# Patient Record
Sex: Male | Born: 1970
Health system: Southern US, Community
[De-identification: ages and names within clinical notes are randomized; demographics above are authoritative.]

## PROBLEM LIST (undated history)

## (undated) DIAGNOSIS — E785 Hyperlipidemia, unspecified: Secondary | ICD-10-CM

## (undated) DIAGNOSIS — B019 Varicella without complication: Secondary | ICD-10-CM

## (undated) HISTORY — DX: Varicella without complication: B01.9

## (undated) HISTORY — DX: Hyperlipidemia, unspecified: E78.5

---

## 2015-08-10 ENCOUNTER — Encounter: Payer: Self-pay | Admitting: Primary Care

## 2015-08-10 ENCOUNTER — Ambulatory Visit (INDEPENDENT_AMBULATORY_CARE_PROVIDER_SITE_OTHER): Payer: BLUE CROSS/BLUE SHIELD | Admitting: Primary Care

## 2015-08-10 VITALS — BP 138/86 | HR 91 | Temp 97.9°F | Ht 69.25 in | Wt 225.0 lb

## 2015-08-10 DIAGNOSIS — E785 Hyperlipidemia, unspecified: Secondary | ICD-10-CM | POA: Diagnosis not present

## 2015-08-10 NOTE — Progress Notes (Signed)
Pre visit review using our clinic review tool, if applicable. No additional management support is needed unless otherwise documented below in the visit note. 

## 2015-08-10 NOTE — Assessment & Plan Note (Signed)
Endorses slightly elevated lipids at work health screening. Will repeat at upcoming physical. No prior medication use.

## 2015-08-10 NOTE — Patient Instructions (Signed)
Please schedule a physical with me within the next 3-6 months. You may also schedule a lab only appointment 3-4 days prior. We will discuss your lab results in detail during your physical.  It was a pleasure to meet you today! Please don't hesitate to call me with any questions. Welcome to [facility]!    

## 2015-08-10 NOTE — Progress Notes (Signed)
   Subjective:    Patient ID: Austin Benson, male    DOB: March 27, 1971, 45 y.o.   MRN: [id number]  HPI  Austin Benson is a 45 year old male who presents today to establish care. He has no complaints today. His last physical was several years ago, but does a wellness check at work annually which includes brief lipid panel. Will obtain old records.  1) Hyperlipidemia: History of TC of 207, elevation in LDL and low HDL per patient. Completes this annually through work, no complete panel. He's never been managed on medication.     Review of Systems  Constitutional: Negative for unexpected weight change.  HENT: Negative for rhinorrhea.   Respiratory: Negative for cough and shortness of breath.   Cardiovascular: Negative for chest pain.  Gastrointestinal: Negative for diarrhea and constipation.  Genitourinary: Negative for difficulty urinating.  Musculoskeletal: Negative for myalgias.       Occasional hand and feet cramping.   Skin: Negative for rash.  Allergic/Immunologic: Positive for environmental allergies.  Neurological: Negative for dizziness, numbness and headaches.  Psychiatric/Behavioral:       Denies concerns for anxiety or depression       Past Medical History  Diagnosis Date  . Chickenpox   . Hyperlipidemia     Social History   Social History  . Marital Status: Married    Spouse Name: N/A  . Number of Children: N/A  . Years of Education: N/A   Occupational History  . Not on file.   Social History Main Topics  . Smoking status: Former Games developer  . Smokeless tobacco: Not on file  . Alcohol Use: No  . Drug Use: Not on file  . Sexual Activity: Not on file   Other Topics Concern  . Not on file   Social History Narrative   Married.   2 children, 3 grandchildren.   Work's as a Chartered certified accountant.    Enjoys hunting, fishing.     History reviewed. No pertinent past surgical history.  Family History  Problem Relation Age of Onset  . Arthritis Mother   . Diabetes Father     . Diabetes Maternal Grandmother   . Arthritis Maternal Grandmother   . Diabetes Maternal Grandfather   . Heart attack Father 70    Allergies not on file  No current outpatient prescriptions on file prior to visit.   No current facility-administered medications on file prior to visit.    BP 138/86 mmHg  Pulse 91  Temp(Src) 97.9 F (36.6 C) (Oral)  Ht 5' 9.25" (1.759 m)  Wt 225 lb (102.059 kg)  BMI 32.99 kg/m2  SpO2 97%    Objective:   Physical Exam  Constitutional: He is oriented to person, place, and time. He appears well-nourished.  Cardiovascular: Normal rate and regular rhythm.   Pulmonary/Chest: Effort normal and breath sounds normal.  Neurological: He is alert and oriented to person, place, and time.  Skin: Skin is warm and dry.  Psychiatric: He has a normal mood and affect.          Assessment & Plan:  Establish Care:  No complaints today. Last physical was years ago, will get basic lipid panel through occupation. Exam unremarkable. He is to schedule complete physical in 3-6 months. PMH, SH, and FH reviewed.

## 2015-11-16 ENCOUNTER — Telehealth: Payer: Self-pay | Admitting: Primary Care

## 2015-11-16 ENCOUNTER — Encounter: Payer: Self-pay | Admitting: Primary Care

## 2015-11-16 ENCOUNTER — Ambulatory Visit (INDEPENDENT_AMBULATORY_CARE_PROVIDER_SITE_OTHER)
Admission: RE | Admit: 2015-11-16 | Discharge: 2015-11-16 | Disposition: A | Discharge status: Home / Self Care | Payer: BLUE CROSS/BLUE SHIELD | Source: Ambulatory Visit | Attending: Primary Care | Admitting: Primary Care

## 2015-11-16 ENCOUNTER — Ambulatory Visit (INDEPENDENT_AMBULATORY_CARE_PROVIDER_SITE_OTHER): Payer: BLUE CROSS/BLUE SHIELD | Admitting: Primary Care

## 2015-11-16 VITALS — BP 130/86 | HR 104 | Temp 98.4°F | Ht 69.25 in | Wt 229.0 lb

## 2015-11-16 DIAGNOSIS — R059 Cough, unspecified: Secondary | ICD-10-CM

## 2015-11-16 DIAGNOSIS — R05 Cough: Secondary | ICD-10-CM

## 2015-11-16 NOTE — Telephone Encounter (Signed)
Patient returned Chan's call. °

## 2015-11-16 NOTE — Progress Notes (Signed)
Pre visit review using our clinic review tool, if applicable. No additional management support is needed unless otherwise documented below in the visit note. 

## 2015-11-16 NOTE — Patient Instructions (Signed)
Stop by the pharmacy and pick up omeprazole 20 mg tablets. Take 1 tablet by mouth every day for 4 weeks.  Complete xray(s) prior to leaving today. I will notify you of your results once received.  Please notify me if no improvement in cough in 2 weeks.  It was a pleasure to see you today!

## 2015-11-16 NOTE — Telephone Encounter (Signed)
Called and notified patient of Austin Benson's comments. Patient verbalized understanding.  

## 2015-11-16 NOTE — Progress Notes (Signed)
Subjective:    Patient ID: Austin Benson, male    DOB: 1970/08/02, 45 y.o.   MRN: [id number]  HPI  Mr. Austin Benson is a 45 year old male who presents today with a chief complaint of cough. He's been coughing intermittently since February 2017. His cough is non productive and is present moreso with activity. Formerly dipped tobacco in the past, smoked for 6 months total, several years ago. His cough has become more bothersome over the past several weeks. He denies fevers, congestion, feeling ill, fatigued, body aches. He's taken Mucinex, Benadryl, Allegra D, Delsym, Robitussin without improvement. He does occasionally get heart burn once weekly. Denies allergy symptoms.  Review of Systems  Constitutional: Negative for fever.  HENT: Negative for congestion, sinus pressure and sore throat.   Respiratory: Positive for cough. Negative for shortness of breath and wheezing.   Gastrointestinal:       Occasional reflux.  Musculoskeletal: Negative for myalgias.       Past Medical History  Diagnosis Date  . Chickenpox   . Hyperlipidemia      Social History   Social History  . Marital Status: Married    Spouse Name: N/A  . Number of Children: N/A  . Years of Education: N/A   Occupational History  . Not on file.   Social History Main Topics  . Smoking status: Former Games developermoker  . Smokeless tobacco: Not on file  . Alcohol Use: No  . Drug Use: Not on file  . Sexual Activity: Not on file   Other Topics Concern  . Not on file   Social History Narrative   Married.   2 children, 3 grandchildren.   Work's as a Chartered certified accountantmachinist.    Enjoys hunting, fishing.     No past surgical history on file.  Family History  Problem Relation Age of Onset  . Arthritis Mother   . Diabetes Father   . Diabetes Maternal Grandmother   . Arthritis Maternal Grandmother   . Diabetes Maternal Grandfather   . Heart attack Father [id number]    No Known Allergies  No current outpatient prescriptions on file prior to  visit.   No current facility-administered medications on file prior to visit.    BP 130/86 mmHg  Pulse 104  Temp(Src) 98.4 F (36.9 C) (Oral)  Ht 5' 9.25" (1.759 m)  Wt 229 lb (103.874 kg)  BMI 33.57 kg/m2  SpO2 97%    Objective:   Physical Exam  Constitutional: He appears well-nourished.  HENT:  Right Ear: Tympanic membrane and ear canal normal.  Left Ear: Tympanic membrane and ear canal normal.  Nose: No mucosal edema. Right sinus exhibits no maxillary sinus tenderness and no frontal sinus tenderness. Left sinus exhibits no maxillary sinus tenderness and no frontal sinus tenderness.  Mouth/Throat: Oropharynx is clear and moist.  Eyes: Conjunctivae are normal.  Neck: Neck supple.  Cardiovascular: Normal rate and regular rhythm.   Pulmonary/Chest: Effort normal and breath sounds normal. He has no wheezes. He has no rales.  Skin: Skin is warm and dry.          Assessment & Plan:  Cough:  Present since February 2017, worse over the last several weeks. No symptoms of bacterial, viral, or allergy symptoms. Does have occasional reflux. Exam today unremarkable. Lungs clear, no wheezing, PND, congestion. Chest Xray today: Negative. Could be GERD and wills start with temporary low dose PPI x 4 weeks. If no improvement, will consider antihistamine, however, he's tried Allegra D without improvement.  May also need to consider pulmonology referral for PFT's if no improvement.

## 2015-12-01 ENCOUNTER — Telehealth: Payer: Self-pay | Admitting: Primary Care

## 2015-12-01 NOTE — Telephone Encounter (Signed)
Pt states he's doing so much better the cough is almost gone, he has two more weeks of the omeprazole to take. He would like to know if he needs to continue taking the omeprazole after the 4 week period?

## 2015-12-01 NOTE — Telephone Encounter (Signed)
Left message to return call 

## 2015-12-01 NOTE — Telephone Encounter (Signed)
Please have him stop taking after 4 weeks and to notify me if cough returns.

## 2015-12-01 NOTE — Telephone Encounter (Signed)
-----   Message from Doreene NestKatherine K Alyha Marines, NP sent at 11/16/2015 11:19 AM EDT ----- Regarding: Cough Will you please check on Austin Benson's cough. We recently started him on omeprazole for his cough. Any improvement?

## 2015-12-01 NOTE — Telephone Encounter (Signed)
Left detailed VM.  

## 2016-04-21 DIAGNOSIS — M79671 Pain in right foot: Secondary | ICD-10-CM | POA: Diagnosis not present

## 2016-04-21 DIAGNOSIS — M722 Plantar fascial fibromatosis: Secondary | ICD-10-CM | POA: Diagnosis not present

## 2016-05-12 DIAGNOSIS — M79671 Pain in right foot: Secondary | ICD-10-CM | POA: Diagnosis not present

## 2016-05-12 DIAGNOSIS — M722 Plantar fascial fibromatosis: Secondary | ICD-10-CM | POA: Diagnosis not present

## 2016-05-23 ENCOUNTER — Telehealth: Payer: Self-pay | Admitting: Primary Care

## 2016-05-23 NOTE — Telephone Encounter (Signed)
Patient Name: Austin Benson  DOB: 1970/09/11    Initial Comment Caller states husband is having chest pain.   Nurse Assessment  Nurse: Scarlette ArStandifer, RN, Heather Date/Time (Eastern Time): 05/23/2016 11:50:16 AM  Confirm and document reason for call. If symptomatic, describe symptoms. You must click the next button to save text entered. ---Caller states husband is having chest pain for the last month, he has a history of anxiety and reflux. The reflux has been getting worse lately. He is not with her right now, he is at work.  Has the patient traveled out of the country within the last 30 days? ---Not Applicable  Does the patient have any new or worsening symptoms? ---Yes  Will a triage be completed? ---No  Select reason for no triage. ---Other  Please document clinical information provided and list any resource used. ---Caller informed that triage cannot be done since her husband is not with her, but it is something that is urgent that needs to be taken care of ASAP. Caller verbalizes understanding and states that she will talk to him ASAP

## 2016-05-23 NOTE — Telephone Encounter (Signed)
Pt called back; pt had CP last week and thinks may have been heartburn; no pain today.pt scheduled appt to see Mayra ReelKate Clark NP on 05/24/16 at 4 pm. FYI to Mayra ReelKate Clark NP.

## 2016-05-23 NOTE — Telephone Encounter (Signed)
Unable to reach Mr or Mrs. Gehres; left v/m [facility]

## 2016-05-23 NOTE — Telephone Encounter (Signed)
Noted  

## 2016-05-24 ENCOUNTER — Encounter: Payer: Self-pay | Admitting: Primary Care

## 2016-05-24 ENCOUNTER — Ambulatory Visit (INDEPENDENT_AMBULATORY_CARE_PROVIDER_SITE_OTHER): Payer: BLUE CROSS/BLUE SHIELD | Admitting: Primary Care

## 2016-05-24 VITALS — BP 160/110 | HR 101 | Temp 98.2°F | Ht 69.25 in | Wt 230.8 lb

## 2016-05-24 DIAGNOSIS — R030 Elevated blood-pressure reading, without diagnosis of hypertension: Secondary | ICD-10-CM | POA: Diagnosis not present

## 2016-05-24 DIAGNOSIS — K219 Gastro-esophageal reflux disease without esophagitis: Secondary | ICD-10-CM | POA: Diagnosis not present

## 2016-05-24 DIAGNOSIS — I10 Essential (primary) hypertension: Secondary | ICD-10-CM | POA: Insufficient documentation

## 2016-05-24 MED ORDER — RANITIDINE HCL 150 MG PO TABS: ORAL_TABLET | ORAL | 1 refills | Status: DC

## 2016-05-24 NOTE — Progress Notes (Signed)
Pre visit review using our clinic review tool, if applicable. No additional management support is needed unless otherwise documented below in the visit note. 

## 2016-05-24 NOTE — Assessment & Plan Note (Addendum)
Chest pain with esophageal burning and belching, improved with PPI use. Suspect chest pain to be secondary to esophageal reflux. No alarm signs concerning for acute MI. Education provided regarding triggers. Will have him trial Zantac 150 mg daily-twice daily. If no improvement will consider PPI as this has historically worked. Will call patient in 2 weeks for an update in symptoms on Zantac.

## 2016-05-24 NOTE — Assessment & Plan Note (Signed)
Above goal in the office today, also endorses elevated reading at podiatrist office and home readings. Will have him monitor BP at home x 2 weeks. If above goal then will initiate Lisinopril.

## 2016-05-24 NOTE — Progress Notes (Signed)
   Subjective:    Patient ID: Austin Benson, male    DOB: September 17, 1970, 45 y.o.   MRN: [id number]  HPI  Austin Benson is a 45 year old male with a history of GERD who presents today with a chief complaint of chest pain. His pain is located to the upper mid chest with associated symptoms of esophageal burning and belching. He has been taking Omeprazole intermittently for GERD symptoms with improvement/resolve of heart burn symptoms and chest pain. As soon as he stopped taking the omeprazole he started experiencing his chest pain with esophageal burning. He denies nausea, radiation of his pain, diaphoresis.   2) Elevated Blood Pressure Reading: No prior history of hypertension. His BP is 160/110 today. His BP was "high" at his podiatrist office 6 weeks ago. He's checking his BP at home with readings of 130-150's/80's (cannot remember exactly) over the last several months. He has a family history of MI in his father. He experiences occasional headaches (mostly from sinus pressure). Denies visual changes, shortness of breath, weakness, dizziness.    Review of Systems  Constitutional: Negative for diaphoresis.  Eyes: Negative for visual disturbance.  Respiratory: Negative for cough, shortness of breath and wheezing.   Cardiovascular: Positive for chest pain. Negative for palpitations.  Gastrointestinal: Negative for abdominal pain and nausea.       Esophageal burning, belching  Neurological: Negative for dizziness and weakness.       Past Medical History:  Diagnosis Date  . Chickenpox   . Hyperlipidemia      Social History   Social History  . Marital status: Married    Spouse name: N/A  . Number of children: N/A  . Years of education: N/A   Occupational History  . Not on file.   Social History Main Topics  . Smoking status: Former Games developermoker  . Smokeless tobacco: Not on file  . Alcohol use No  . Drug use: Unknown  . Sexual activity: Not on file   Other Topics Concern  . Not on file    Social History Narrative   Married.   2 children, 3 grandchildren.   Work's as a Chartered certified accountantmachinist.    Enjoys hunting, fishing.     No past surgical history on file.  Family History  Problem Relation Age of Onset  . Arthritis Mother   . Diabetes Father   . Diabetes Maternal Grandmother   . Arthritis Maternal Grandmother   . Diabetes Maternal Grandfather   . Heart attack Father [phone]    No Known Allergies  No current outpatient prescriptions on file prior to visit.   No current facility-administered medications on file prior to visit.     BP (!) 160/110   Pulse (!) 101   Temp 98.2 F (36.8 C) (Oral)   Ht 5' 9.25" (1.759 m)   Wt 230 lb 12.8 oz (104.7 kg)   SpO2 96%   BMI 33.84 kg/m    Objective:   Physical Exam  Constitutional: He appears well-nourished.  Neck: Neck supple.  Cardiovascular: Normal rate and regular rhythm.   Pulmonary/Chest: Effort normal and breath sounds normal.  Abdominal: Soft. Bowel sounds are normal. There is no tenderness.  Skin: Skin is warm and dry.          Assessment & Plan:

## 2016-05-24 NOTE — Patient Instructions (Signed)
Start ranitidine (Zantac) 150 mg tablets daily for acid reflux. Take 1 tablet by mouth once daily. If no improvement then may take 1 tablet twice daily.  Avoid laying down after eating. You should wait 2 hours before laying down after eating any meal.  Take a look at the trigger foods associated with acid reflux.  Check your blood pressure daily, around the same time of day, for the next 2 weeks.  Ensure that you have rested for 30 minutes prior to checking your blood pressure. Record your readings as I will call you for those readings.  I will check on you in 2 weeks!  Food Choices for Gastroesophageal Reflux Disease, Adult When you have gastroesophageal reflux disease (GERD), the foods you eat and your eating habits are very important. Choosing the right foods can help ease the discomfort of GERD. WHAT GENERAL GUIDELINES DO I NEED TO FOLLOW?  Choose fruits, vegetables, whole grains, low-fat dairy products, and low-fat meat, fish, and poultry.  Limit fats such as oils, salad dressings, butter, nuts, and avocado.  Keep a food diary to identify foods that cause symptoms.  Avoid foods that cause reflux. These may be different for different people.  Eat frequent small meals instead of three large meals each day.  Eat your meals slowly, in a relaxed setting.  Limit fried foods.  Cook foods using methods other than frying.  Avoid drinking alcohol.  Avoid drinking large amounts of liquids with your meals.  Avoid bending over or lying down until 2-3 hours after eating. WHAT FOODS ARE NOT RECOMMENDED? The following are some foods and drinks that may worsen your symptoms: Vegetables Tomatoes. Tomato juice. Tomato and spaghetti sauce. Chili peppers. Onion and garlic. Horseradish. Fruits Oranges, grapefruit, and lemon (fruit and juice). Meats High-fat meats, fish, and poultry. This includes hot dogs, ribs, ham, sausage, salami, and bacon. Dairy Whole milk and chocolate milk. Sour  cream. Cream. Butter. Ice cream. Cream cheese.  Beverages Coffee and tea, with or without caffeine. Carbonated beverages or energy drinks. Condiments Hot sauce. Barbecue sauce.  Sweets/Desserts Chocolate and cocoa. Donuts. Peppermint and spearmint. Fats and Oils High-fat foods, including JamaicaFrench fries and potato chips. Other Vinegar. Strong spices, such as black pepper, white pepper, red pepper, cayenne, curry powder, cloves, ginger, and chili powder. The items listed above may not be a complete list of foods and beverages to avoid. Contact your dietitian for more information.   This information is not intended to replace advice given to you by your health care provider. Make sure you discuss any questions you have with your health care provider.   Document Released: 07/04/2005 Document Revised: 07/25/2014 Document Reviewed: 05/08/2013 Elsevier Interactive Patient Education [employer]2016 Elsevier Inc.

## 2016-06-01 ENCOUNTER — Telehealth: Payer: Self-pay

## 2016-06-01 DIAGNOSIS — I10 Essential (primary) hypertension: Secondary | ICD-10-CM

## 2016-06-01 MED ORDER — LISINOPRIL 10 MG PO TABS: 10.0000 mg | ORAL_TABLET | Freq: Every day | ORAL | 1 refills | Status: DC

## 2016-06-01 NOTE — Telephone Encounter (Signed)
Mrs Ladona RidgelRatliff called pt last seen 05/24/2016.BP readings;  05/25/16 BP 137/79 05/26/16 BP 140/89 05/27/16 BP 161/92 05/28/16 BP 124/96 05/29/16 BP 141/82 05/30/16 BP 141/93 05/31/16 BP 143/84 06/01/16 BP 141/84 Pt is not having any H/A, CP or dizziness. Mrs Ladona RidgelRatliff said can call pt back after 3:30 on 06/02/16. [facility].

## 2016-06-01 NOTE — Telephone Encounter (Signed)
Please notify patient that his blood pressure is too high which will require treatment with medication. I've sent a prescription of Lisinopril 10 mg to his pharmacy. Take 1 tablet by mouth once daily. Please have them continue to monitor his BP daily and schedule him for follow up in the office in 2-3 weeks for recheck.

## 2016-06-02 NOTE — Telephone Encounter (Signed)
Patient advised.   Patient says he has to have a 4 pm appt and that was not available.  I advised the patient to phone in within the next few days to see if an exception could be made.

## 2016-06-03 NOTE — Telephone Encounter (Signed)
Please call patient today and get him scheduled for follow up in 2-3 weeks. We will work with his schedule.

## 2016-06-06 NOTE — Telephone Encounter (Signed)
Spoken to patient and patient is coming on 06/20/2016 since he took a day off

## 2016-06-07 ENCOUNTER — Telehealth: Payer: Self-pay | Admitting: Primary Care

## 2016-06-07 NOTE — Telephone Encounter (Signed)
-----   Message from Doreene NestKatherine K Clark, NP sent at 05/24/2016  4:24 PM EST ----- Regarding: BP and GERD Please check on patient's blood pressure. What are his readings? Any improvement in acid reflux since we started Zantac?

## 2016-06-13 NOTE — Telephone Encounter (Signed)
Message left for patient to return my call.  

## 2016-06-20 ENCOUNTER — Ambulatory Visit (INDEPENDENT_AMBULATORY_CARE_PROVIDER_SITE_OTHER): Payer: BLUE CROSS/BLUE SHIELD | Admitting: Primary Care

## 2016-06-20 ENCOUNTER — Encounter: Payer: Self-pay | Admitting: Primary Care

## 2016-06-20 DIAGNOSIS — R030 Elevated blood-pressure reading, without diagnosis of hypertension: Secondary | ICD-10-CM

## 2016-06-20 DIAGNOSIS — I10 Essential (primary) hypertension: Secondary | ICD-10-CM | POA: Diagnosis not present

## 2016-06-20 LAB — BASIC METABOLIC PANEL
BUN: 21 mg/dL (ref 6–23)
CALCIUM: 10 mg/dL (ref 8.4–10.5)
CO2: 29 meq/L (ref 19–32)
CREATININE: 1.06 mg/dL (ref 0.40–1.50)
Chloride: 102 mEq/L (ref 96–112)
GFR: 79.43 mL/min (ref >60.00)
Glucose, Bld: 119 mg/dL — ABNORMAL HIGH (ref 70–99)
Potassium: 4.8 mEq/L (ref 3.5–5.1)
SODIUM: 140 meq/L (ref 135–145)

## 2016-06-20 MED ORDER — LISINOPRIL 10 MG PO TABS: 10.0000 mg | ORAL_TABLET | Freq: Every day | ORAL | 3 refills | Status: DC

## 2016-06-20 NOTE — Assessment & Plan Note (Signed)
Improved on Lisinopril 10 mg.  Home readings stable.  Discussed to monitor 1-2 x monthly, report readings at or above 140/90. BMP pending today. Refills sent through mail order pharmacy.

## 2016-06-20 NOTE — Progress Notes (Signed)
   Subjective:    Patient ID: Austin Benson, male    DOB: October 18, 1970, 45 y.o.   MRN: [id number]  HPI  Mr. Austin Benson is a 45 year old male who presents today for follow up of hypertension. He was evaluated in early November 2017 with complaints of elevated blood pressure. He had numerous prior elevated readings. He was initiated on Lisinopril 10 mg tablets several weeks later due to elevated home readings. He is here today for follow up.  Since his last visit he's been checking his BP at home. He is getting readings of 120-130's/80's. He denies chest pain, dizziness, headaches, cough, lower extremity edema. He's compliant to his medication.  Review of Systems  Respiratory: Negative for cough and shortness of breath.   Cardiovascular: Negative for chest pain and leg swelling.  Neurological: Negative for dizziness and headaches.       Past Medical History:  Diagnosis Date  . Chickenpox   . Hyperlipidemia      Social History   Social History  . Marital status: Married    Spouse name: N/A  . Number of children: N/A  . Years of education: N/A   Occupational History  . Not on file.   Social History Main Topics  . Smoking status: Former Games developermoker  . Smokeless tobacco: Not on file  . Alcohol use No  . Drug use: Unknown  . Sexual activity: Not on file   Other Topics Concern  . Not on file   Social History Narrative   Married.   2 children, 3 grandchildren.   Work's as a Chartered certified accountantmachinist.    Enjoys hunting, fishing.     No past surgical history on file.  Family History  Problem Relation Age of Onset  . Arthritis Mother   . Diabetes Father   . Diabetes Maternal Grandmother   . Arthritis Maternal Grandmother   . Diabetes Maternal Grandfather   . Heart attack Father [phone]    No Known Allergies  Current Outpatient Prescriptions on File Prior to Visit  Medication Sig Dispense Refill  . etodolac (LODINE) 500 MG tablet Take 500 mg by mouth 2 (two) times daily.    . ranitidine  (ZANTAC) 150 MG tablet Take 1 tablet by mouth 1-2 times daily for acid reflux. 60 tablet 1   No current facility-administered medications on file prior to visit.     BP 140/88   Pulse 81   Temp 98.5 F (36.9 C) (Oral)   Ht 5\' 9"  (1.753 m)   Wt 228 lb 12.8 oz (103.8 kg)   SpO2 97%   BMI 33.79 kg/m    Objective:   Physical Exam  Constitutional: He appears well-nourished.  Neck: Neck supple.  Cardiovascular: Normal rate and regular rhythm.   Pulmonary/Chest: Effort normal and breath sounds normal.  Skin: Skin is warm and dry.          Assessment & Plan:

## 2016-06-20 NOTE — Progress Notes (Signed)
Pre visit review using our clinic review tool, if applicable. No additional management support is needed unless otherwise documented below in the visit note. 

## 2016-06-20 NOTE — Patient Instructions (Addendum)
Complete lab work prior to leaving today. I will notify you of your results once received.   I sent refills of Lisinopril 10 mg through [facility]. You can take this medication either in the morning or in the evening.  It was a pleasure to see you today!   Hypertension Hypertension, commonly called high blood pressure, is when the force of blood pumping through your arteries is too strong. Your arteries are the blood vessels that carry blood from your heart throughout your body. A blood pressure reading consists of a higher number over a lower number, such as 110/72. The higher number (systolic) is the pressure inside your arteries when your heart pumps. The lower number (diastolic) is the pressure inside your arteries when your heart relaxes. Ideally you want your blood pressure below 120/80. Hypertension forces your heart to work harder to pump blood. Your arteries may become narrow or stiff. Having untreated or uncontrolled hypertension can cause heart attack, stroke, kidney disease, and other problems. What increases the risk? Some risk factors for high blood pressure are controllable. Others are not. Risk factors you cannot control include:  Race. You may be at higher risk if you are African American.  Age. Risk increases with age.  Gender. Men are at higher risk than women before age 10145 years. After age 47, women are at higher risk than men. Risk factors you can control include:  Not getting enough exercise or physical activity.  Being overweight.  Getting too much fat, sugar, calories, or salt in your diet.  Drinking too much alcohol. What are the signs or symptoms? Hypertension does not usually cause signs or symptoms. Extremely high blood pressure (hypertensive crisis) may cause headache, anxiety, shortness of breath, and nosebleed. How is this diagnosed? To check if you have hypertension, your health care provider will measure your blood pressure while you are seated,  with your arm held at the level of your heart. It should be measured at least twice using the same arm. Certain conditions can cause a difference in blood pressure between your right and left arms. A blood pressure reading that is higher than normal on one occasion does not mean that you need treatment. If it is not clear whether you have high blood pressure, you may be asked to return on a different day to have your blood pressure checked again. Or, you may be asked to monitor your blood pressure at home for 1 or more weeks. How is this treated? Treating high blood pressure includes making lifestyle changes and possibly taking medicine. Living a healthy lifestyle can help lower high blood pressure. You may need to change some of your habits. Lifestyle changes may include:  Following the DASH diet. This diet is high in fruits, vegetables, and whole grains. It is low in salt, red meat, and added sugars.  Keep your sodium intake below 2,300 mg per day.  Getting at least 30-45 minutes of aerobic exercise at least 4 times per week.  Losing weight if necessary.  Not smoking.  Limiting alcoholic beverages.  Learning ways to reduce stress. Your health care provider may prescribe medicine if lifestyle changes are not enough to get your blood pressure under control, and if one of the following is true:  You are 2618-47 years of age and your systolic blood pressure is above 140.  You are 47 years of age or older, and your systolic blood pressure is above 150.  Your diastolic blood pressure is above 90.  You have  diabetes, and your systolic blood pressure is over 140 or your diastolic blood pressure is over 90.  You have kidney disease and your blood pressure is above 140/90.  You have heart disease and your blood pressure is above 140/90. Your personal target blood pressure may vary depending on your medical conditions, your age, and other factors. Follow these instructions at home:  Have your  blood pressure rechecked as directed by your health care provider.  Take medicines only as directed by your health care provider. Follow the directions carefully. Blood pressure medicines must be taken as prescribed. The medicine does not work as well when you skip doses. Skipping doses also puts you at risk for problems.  Do not smoke.  Monitor your blood pressure at home as directed by your health care provider. Contact a health care provider if:  You think you are having a reaction to medicines taken.  You have recurrent headaches or feel dizzy.  You have swelling in your ankles.  You have trouble with your vision. Get help right away if:  You develop a severe headache or confusion.  You have unusual weakness, numbness, or feel faint.  You have severe chest or abdominal pain.  You vomit repeatedly.  You have trouble breathing. This information is not intended to replace advice given to you by your health care provider. Make sure you discuss any questions you have with your health care provider. Document Released: 07/04/2005 Document Revised: 12/10/2015 Document Reviewed: 04/26/2013 Elsevier Interactive Patient Education  2017 [employer]

## 2016-06-22 ENCOUNTER — Other Ambulatory Visit: Payer: Self-pay | Admitting: Primary Care

## 2016-06-22 DIAGNOSIS — R739 Hyperglycemia, unspecified: Secondary | ICD-10-CM

## 2016-09-15 DIAGNOSIS — M79671 Pain in right foot: Secondary | ICD-10-CM | POA: Diagnosis not present

## 2016-09-15 DIAGNOSIS — M722 Plantar fascial fibromatosis: Secondary | ICD-10-CM | POA: Diagnosis not present

## 2016-12-26 ENCOUNTER — Encounter: Payer: Self-pay | Admitting: Primary Care

## 2016-12-26 ENCOUNTER — Ambulatory Visit (INDEPENDENT_AMBULATORY_CARE_PROVIDER_SITE_OTHER): Payer: BLUE CROSS/BLUE SHIELD | Admitting: Primary Care

## 2016-12-26 VITALS — BP 136/84 | HR 85 | Temp 98.5°F | Ht 69.25 in | Wt 230.1 lb

## 2016-12-26 DIAGNOSIS — J069 Acute upper respiratory infection, unspecified: Secondary | ICD-10-CM | POA: Diagnosis not present

## 2016-12-26 MED ORDER — OMEPRAZOLE 20 MG PO CPDR: 20.0000 mg | DELAYED_RELEASE_CAPSULE | Freq: Every day | ORAL | 3 refills | Status: DC

## 2016-12-26 MED ORDER — AZITHROMYCIN 250 MG PO TABS: ORAL_TABLET | ORAL | 0 refills | Status: DC

## 2016-12-26 NOTE — Patient Instructions (Signed)
Start Azithromycin antibiotics. Take 2 tablets by mouth today, then 1 tablet daily for 4 additional days.  Try Robitussin or Delsym DM for cough and congestion.  Ensure you are staying hydrated with water and rest.  It was a pleasure to see you today!

## 2016-12-26 NOTE — Progress Notes (Signed)
Subjective:    Patient ID: Loren RacerJack Neukam, male    DOB: 09-27-1970, 46 y.o.   MRN: [id number]  HPI  [name] is a 46 year old male with a history of GERD who presents today with a chief complaint of cough. He also reports sore throat, nasal congestion. His symptoms began 1 week ago after mowing the yard. He's been taking Benadryl, Tylenol Sinus, Mucinex without much improvement. His cough is non productive. Overall he's feeling worse.  Review of Systems  Constitutional: Positive for chills and fatigue. Negative for fever.  HENT: Positive for congestion, sinus pressure and sore throat. Negative for ear pain.   Respiratory: Positive for cough. Negative for shortness of breath and wheezing.        Past Medical History:  Diagnosis Date  . Chickenpox   . Hyperlipidemia      Social History   Social History  . Marital status: Married    Spouse name: N/A  . Number of children: N/A  . Years of education: N/A   Occupational History  . Not on file.   Social History Main Topics  . Smoking status: Former Games developermoker  . Smokeless tobacco: Never Used  . Alcohol use No  . Drug use: Unknown  . Sexual activity: Not on file   Other Topics Concern  . Not on file   Social History Narrative   Married.   2 children, 3 grandchildren.   Work's as a Chartered certified accountantmachinist.    Enjoys hunting, fishing.     No past surgical history on file.  Family History  Problem Relation Age of Onset  . Arthritis Mother   . Diabetes Father   . Diabetes Maternal Grandmother   . Arthritis Maternal Grandmother   . Diabetes Maternal Grandfather   . Heart attack Father 1352    No Known Allergies  Current Outpatient Prescriptions on File Prior to Visit  Medication Sig Dispense Refill  . etodolac (LODINE) 500 MG tablet Take 500 mg by mouth 2 (two) times daily.    Marland Kitchen. lisinopril (PRINIVIL,ZESTRIL) 10 MG tablet Take 1 tablet (10 mg total) by mouth daily. 90 tablet 3  . ranitidine (ZANTAC) 150 MG tablet Take 1 tablet by  mouth 1-2 times daily for acid reflux. 60 tablet 1   No current facility-administered medications on file prior to visit.     BP 136/84   Pulse 85   Temp 98.5 F (36.9 C) (Oral)   Ht 5' 9.25" (1.759 m)   Wt 230 lb 1.9 oz (104.4 kg)   SpO2 98%   BMI 33.74 kg/m    Objective:   Physical Exam  Constitutional: He appears well-nourished. He does not appear ill.  HENT:  Right Ear: Tympanic membrane and ear canal normal.  Left Ear: Tympanic membrane and ear canal normal.  Nose: No mucosal edema. Right sinus exhibits no maxillary sinus tenderness and no frontal sinus tenderness. Left sinus exhibits no maxillary sinus tenderness and no frontal sinus tenderness.  Mouth/Throat: Oropharynx is clear and moist.  Eyes: Conjunctivae are normal.  Neck: Neck supple.  Cardiovascular: Normal rate and regular rhythm.   Pulmonary/Chest: Effort normal. He has no wheezes. He has rhonchi in the right upper field, the right lower field, the left upper field and the left lower field. He has no rales.  Skin: Skin is warm and dry.          Assessment & Plan:  Upper Respiratory Infection:  Cough, congestion, fatigue x 1 week after mowing. Overall  worse. No improvement with OTC treatment. Exam today with mild-moderate rhonchi throughout, suspicious for bacterial involvement. Rx for Zpak sent to pharmacy. Discussed use of Delsym/Robitussin, Mucinex. Fluids, rest.  Morrie Sheldon, NP

## 2017-01-30 ENCOUNTER — Encounter: Payer: Self-pay | Admitting: Primary Care

## 2017-02-24 ENCOUNTER — Encounter: Payer: Self-pay | Admitting: Primary Care

## 2017-03-10 IMAGING — DX DG CHEST 2V
2 series · 2 of 2 positions shown · non-contrast
Comparison: None.

CLINICAL DATA: Cough.  Former smoker.

EXAM:
CHEST  2 VIEW

[chest pa]
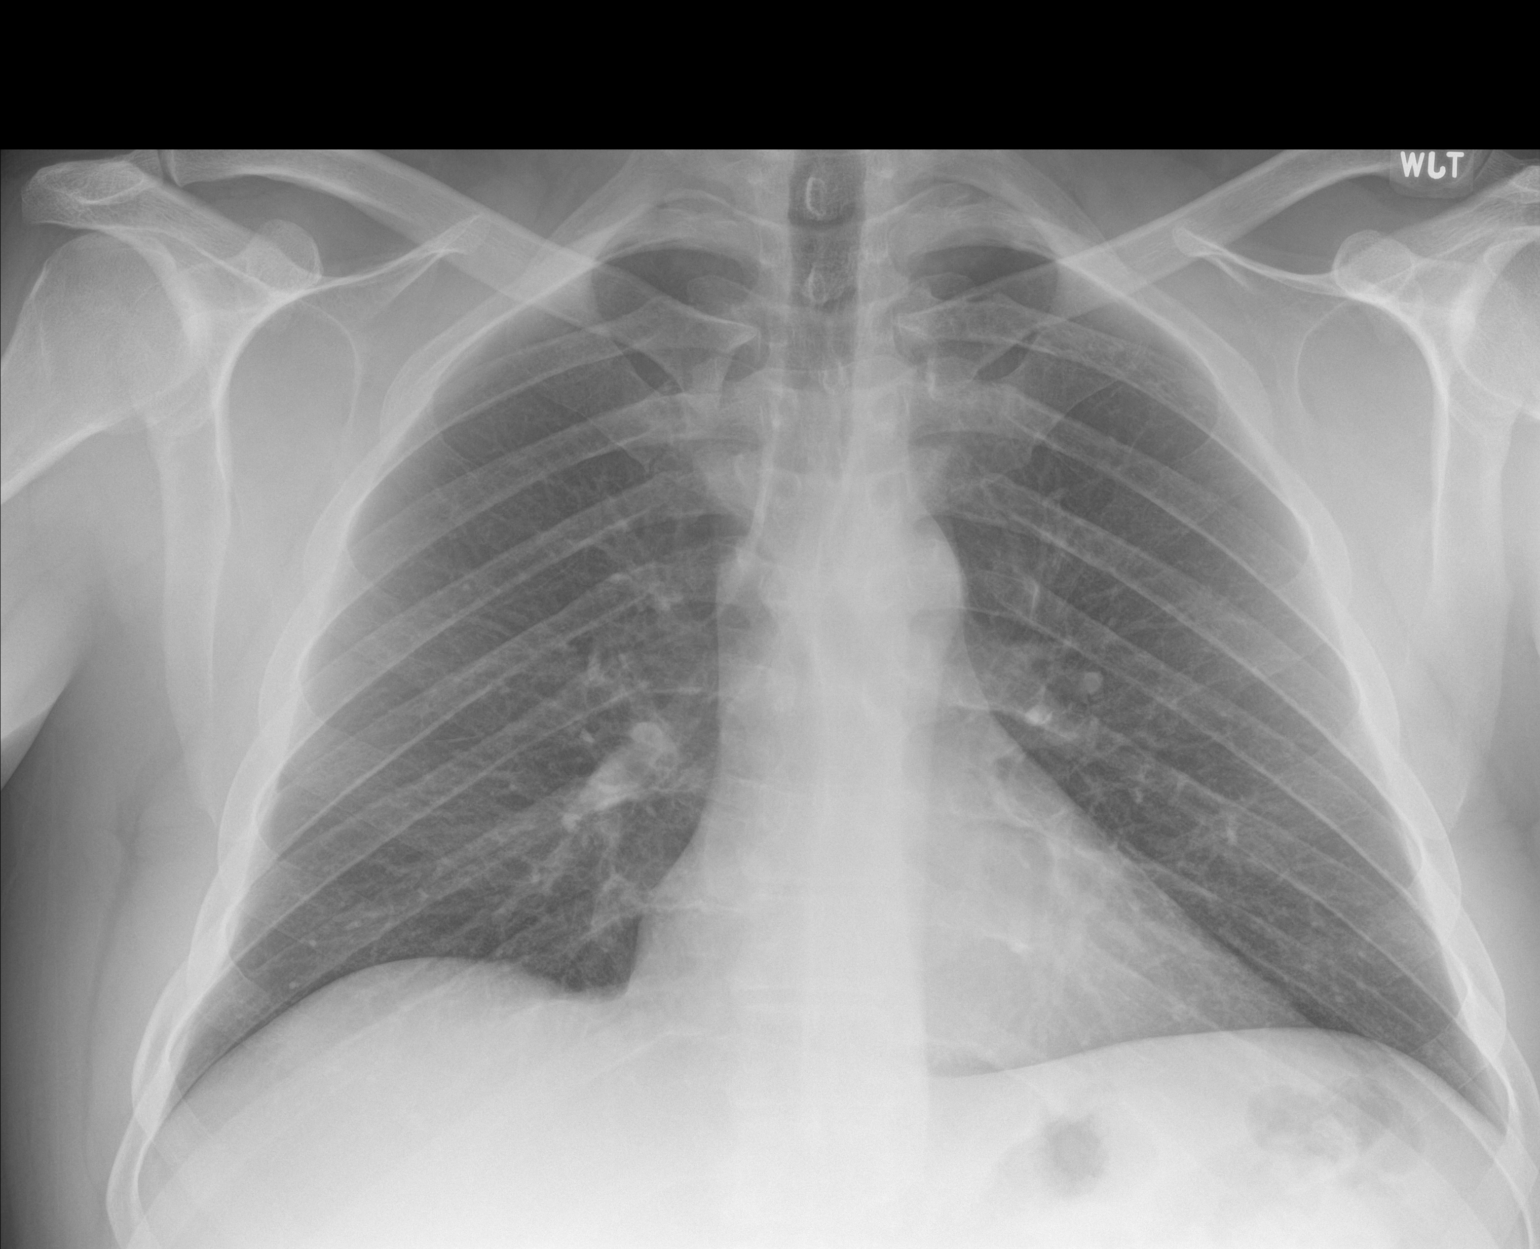

[chest lat]
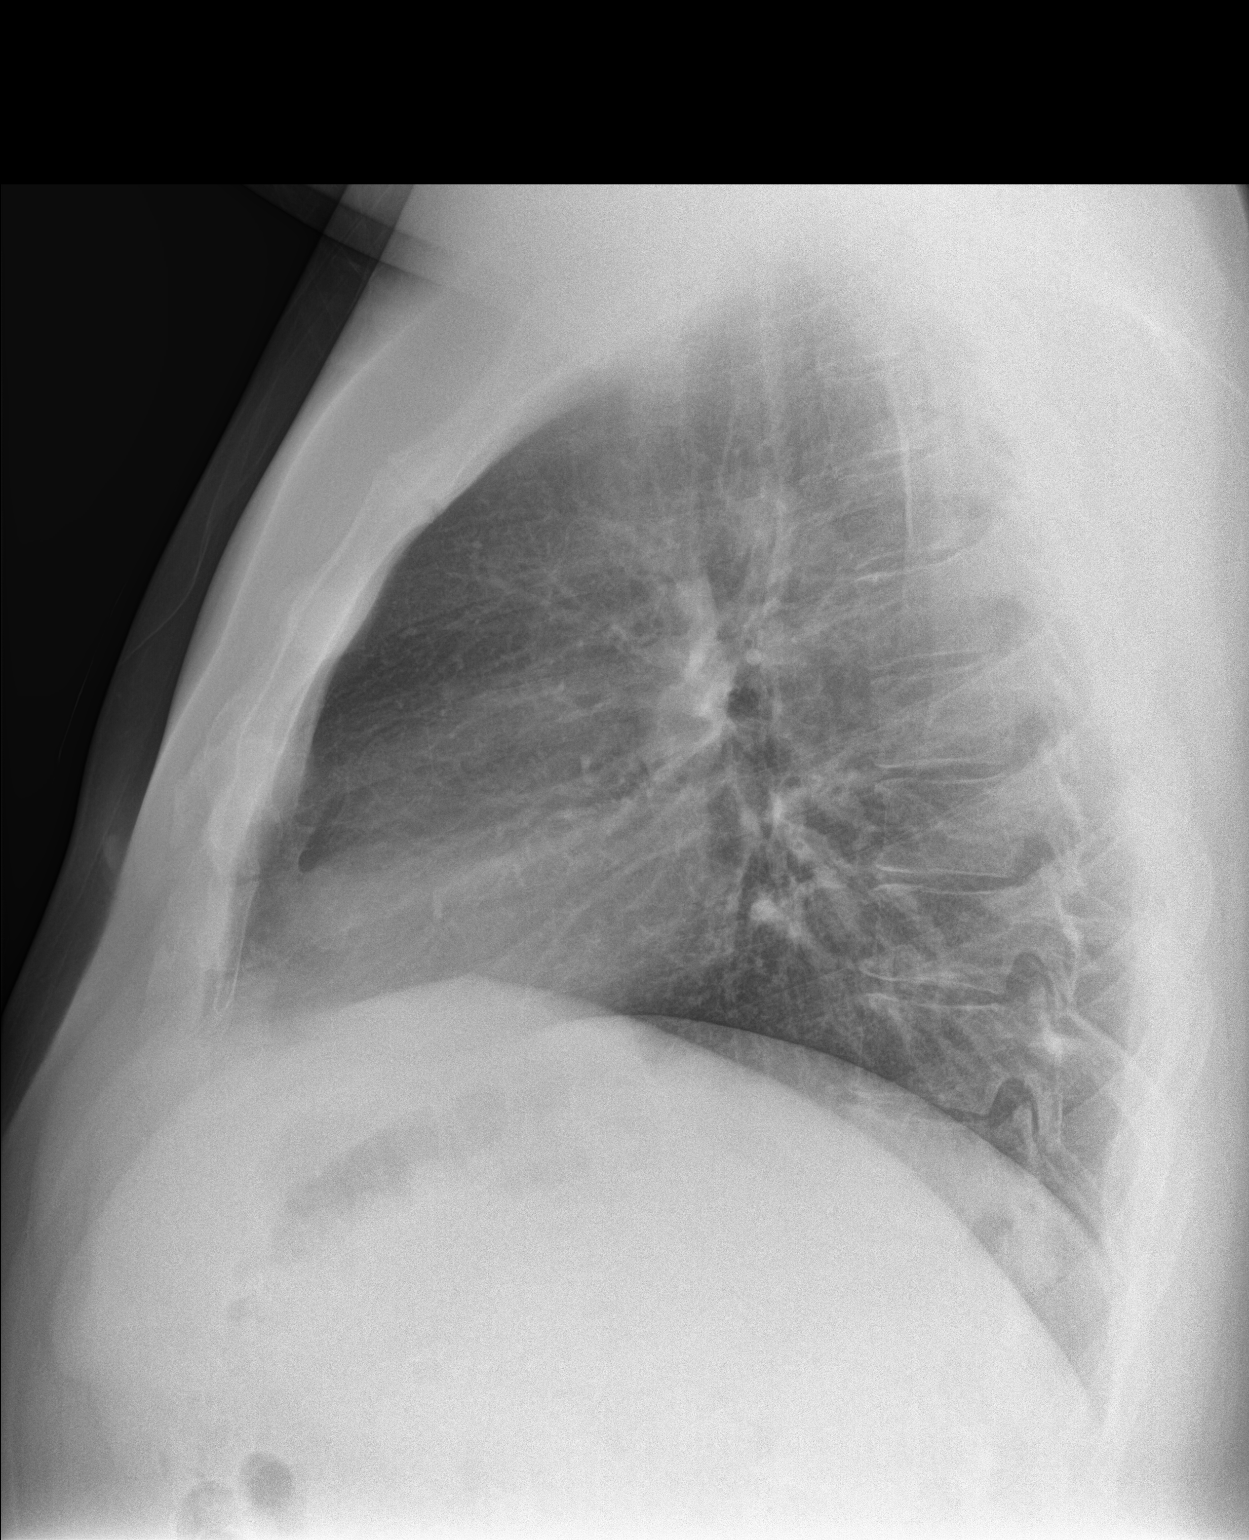

[2 of 2 positions shown; findings below may reference images not displayed]

FINDINGS: The heart size and mediastinal contours are within normal limits.
Both lungs are clear. The visualized skeletal structures are
unremarkable.
IMPRESSION: No active cardiopulmonary disease.

## 2017-05-22 ENCOUNTER — Other Ambulatory Visit: Payer: Self-pay | Admitting: *Deleted

## 2017-05-22 ENCOUNTER — Telehealth: Payer: Self-pay

## 2017-05-22 ENCOUNTER — Telehealth: Payer: Self-pay | Admitting: Primary Care

## 2017-05-22 ENCOUNTER — Other Ambulatory Visit: Payer: Self-pay

## 2017-05-22 DIAGNOSIS — I10 Essential (primary) hypertension: Secondary | ICD-10-CM

## 2017-05-22 MED ORDER — LISINOPRIL 10 MG PO TABS: 10.0000 mg | ORAL_TABLET | Freq: Every day | ORAL | 0 refills | Status: DC

## 2017-05-22 MED ORDER — OMEPRAZOLE 20 MG PO CPDR: 20.0000 mg | DELAYED_RELEASE_CAPSULE | Freq: Every day | ORAL | 0 refills | Status: DC

## 2017-05-22 NOTE — Telephone Encounter (Signed)
Called pt. Left message to clarify which location of [facility] for RX

## 2017-05-22 NOTE — Telephone Encounter (Signed)
Copied from CRM #[id number]. Topic: General - Other >> May 22, 2017  3:04 PM Cipriano BunkerLambe, Annette S wrote: Reason for CRM: [facility] to [facility] [address], [place] - [id number] [place] [id number] Phone: [phone] Fax: [phone] Not a 24 hour pharmacy; exact hours not known  If possible call in 90 day prescriptions

## 2017-05-22 NOTE — Telephone Encounter (Signed)
Copied from CRM [id number]. Topic: Quick Communication - See Telephone Encounter >> May 22, 2017  3:50 PM Everardo PacificMoton, Mykael Batz, [place] wrote: CRM for notification. See Telephone encounter for: Patient would like for his Lisinopril sent to the [facility] [place] in a 3 months supply. Patient will no longer be using the [facility] medication. Patient also needs a refill on th Lisinopril as well. Patient will begin to receive the  Omeprazole over the counter 05/22/17.

## 2017-05-22 NOTE — Telephone Encounter (Signed)
[facility] [address] [facility]

## 2017-09-05 ENCOUNTER — Encounter: Payer: Self-pay | Admitting: Primary Care

## 2017-09-05 DIAGNOSIS — K219 Gastro-esophageal reflux disease without esophagitis: Secondary | ICD-10-CM

## 2017-09-05 DIAGNOSIS — I10 Essential (primary) hypertension: Secondary | ICD-10-CM

## 2017-09-05 MED ORDER — LISINOPRIL 10 MG PO TABS: 10.0000 mg | ORAL_TABLET | Freq: Every day | ORAL | 0 refills | Status: DC

## 2017-09-05 MED ORDER — OMEPRAZOLE 40 MG PO CPDR: 40.0000 mg | DELAYED_RELEASE_CAPSULE | Freq: Every day | ORAL | 0 refills | Status: DC

## 2017-09-05 NOTE — Telephone Encounter (Signed)
Ok to refill? Lisinopril 10 mg and omeprazole 40 mg. Patient has omeprazole 20 mg on current medication list.

## 2017-10-11 ENCOUNTER — Telehealth: Payer: Self-pay

## 2017-10-11 DIAGNOSIS — E785 Hyperlipidemia, unspecified: Secondary | ICD-10-CM

## 2017-10-11 DIAGNOSIS — I10 Essential (primary) hypertension: Secondary | ICD-10-CM

## 2017-10-11 NOTE — Telephone Encounter (Signed)
Noted, [facility]

## 2017-10-11 NOTE — Telephone Encounter (Signed)
Patient requests to be set up for annual wellness exam.  CPE set up with Mayra ReelKate Clark, NP for 11/10/17.  In addition, he would like to come in for lab work on 11/01/17 for routine blood work. I have set up lab appointment for patient.  Note: he asks that we send his blood to [facility] as he is under his wife's insurance and this will be cheaper.  I have flagged the lab appt and have instructed patient to be sure to let the lab person know BEFORE she draws his blood.  He knows to come in fasting for this blood work.  Mayra ReelKate Clark, NP,  Please put in necessary orders. Thanks!

## 2017-11-01 ENCOUNTER — Other Ambulatory Visit (INDEPENDENT_AMBULATORY_CARE_PROVIDER_SITE_OTHER): Payer: BLUE CROSS/BLUE SHIELD

## 2017-11-01 DIAGNOSIS — I10 Essential (primary) hypertension: Secondary | ICD-10-CM

## 2017-11-01 DIAGNOSIS — E785 Hyperlipidemia, unspecified: Secondary | ICD-10-CM | POA: Diagnosis not present

## 2017-11-02 LAB — HEMOGLOBIN A1C
Est. average glucose Bld gHb Est-mCnc: 128 mg/dL
Hgb A1c MFr Bld: 6.1 % — ABNORMAL HIGH (ref 4.8–5.6)

## 2017-11-02 LAB — COMPREHENSIVE METABOLIC PANEL WITH GFR
ALT: 36 IU/L (ref 0–44)
AST: 25 IU/L (ref 0–40)
Albumin/Globulin Ratio: 2 (ref 1.2–2.2)
Albumin: 4.8 g/dL (ref 3.5–5.5)
Alkaline Phosphatase: 91 IU/L (ref 39–117)
BUN/Creatinine Ratio: 23 — ABNORMAL HIGH (ref 9–20)
BUN: 21 mg/dL (ref 6–24)
Bilirubin Total: 0.6 mg/dL (ref 0.0–1.2)
CO2: 25 mmol/L (ref 20–29)
Calcium: 10 mg/dL (ref 8.7–10.2)
Chloride: 99 mmol/L (ref 96–106)
Creatinine, Ser: 0.9 mg/dL (ref 0.76–1.27)
GFR calc Af Amer: 116 mL/min/1.73
GFR calc non Af Amer: 101 mL/min/1.73
Globulin, Total: 2.4 g/dL (ref 1.5–4.5)
Glucose: 131 mg/dL — ABNORMAL HIGH (ref 65–99)
Potassium: 4.5 mmol/L (ref 3.5–5.2)
Sodium: 138 mmol/L (ref 134–144)
Total Protein: 7.2 g/dL (ref 6.0–8.5)

## 2017-11-02 LAB — LIPID PANEL
CHOL/HDL RATIO: 5.8 ratio — AB (ref 0.0–5.0)
Cholesterol, Total: 219 mg/dL — ABNORMAL HIGH (ref 100–199)
HDL: 38 mg/dL — AB (ref >39)
LDL Calculated: 121 mg/dL — ABNORMAL HIGH (ref 0–99)
Triglycerides: 299 mg/dL — ABNORMAL HIGH (ref 0–149)
VLDL Cholesterol Cal: 60 mg/dL — ABNORMAL HIGH (ref 5–40)

## 2017-11-10 ENCOUNTER — Ambulatory Visit (INDEPENDENT_AMBULATORY_CARE_PROVIDER_SITE_OTHER): Payer: BLUE CROSS/BLUE SHIELD | Admitting: Primary Care

## 2017-11-10 ENCOUNTER — Encounter: Payer: Self-pay | Admitting: Primary Care

## 2017-11-10 VITALS — BP 126/82 | HR 81 | Temp 98.6°F | Ht 69.25 in | Wt 225.5 lb

## 2017-11-10 DIAGNOSIS — Z0000 Encounter for general adult medical examination without abnormal findings: Secondary | ICD-10-CM | POA: Insufficient documentation

## 2017-11-10 DIAGNOSIS — E1165 Type 2 diabetes mellitus with hyperglycemia: Secondary | ICD-10-CM | POA: Insufficient documentation

## 2017-11-10 DIAGNOSIS — E782 Mixed hyperlipidemia: Secondary | ICD-10-CM

## 2017-11-10 DIAGNOSIS — Z23 Encounter for immunization: Secondary | ICD-10-CM

## 2017-11-10 DIAGNOSIS — E119 Type 2 diabetes mellitus without complications: Secondary | ICD-10-CM | POA: Insufficient documentation

## 2017-11-10 DIAGNOSIS — R7303 Prediabetes: Secondary | ICD-10-CM | POA: Diagnosis not present

## 2017-11-10 DIAGNOSIS — K219 Gastro-esophageal reflux disease without esophagitis: Secondary | ICD-10-CM | POA: Diagnosis not present

## 2017-11-10 DIAGNOSIS — I10 Essential (primary) hypertension: Secondary | ICD-10-CM

## 2017-11-10 MED ORDER — OMEPRAZOLE 40 MG PO CPDR
DELAYED_RELEASE_CAPSULE | ORAL | 3 refills | Status: DC
Start: 2017-11-10 — End: 2018-10-29

## 2017-11-10 MED ORDER — LISINOPRIL 10 MG PO TABS: ORAL_TABLET | ORAL | 3 refills | Status: DC

## 2017-11-10 NOTE — Assessment & Plan Note (Signed)
Recent A1C of 6.1. Long discussion about diet and changes that need to be made. Also recommended regular exercise.

## 2017-11-10 NOTE — Assessment & Plan Note (Signed)
Td due, provided today. Discussed the importance of a healthy diet and regular exercise in order for weight loss, and to reduce the risk of any potential medical problems. Exam unremarkable. Labs with hyperlipidemia and prediabetes, will continue to monitor.  Follow up in 1 year for CPE.

## 2017-11-10 NOTE — Assessment & Plan Note (Signed)
Elevated LDL, Trigs, TC. Will have him work on lifestyle changes through diet and exercise. Repeat lipids in 6 months.   The 10-year ASCVD risk score Denman George(Goff DC Montez HagemanJr., et al., 2013) is: 5%   Values used to calculate the score:     Age: [age] years     Sex: Male     Is Non-Hispanic African American: No     Diabetic: No     Tobacco smoker: No     Systolic Blood Pressure: 126 mmHg     Is BP treated: Yes     HDL Cholesterol: 38 mg/dL     Total Cholesterol: 219 mg/dL

## 2017-11-10 NOTE — Patient Instructions (Addendum)
Try to work on a healthier diet with lots of colorful veggies, fruit, lean protein (Chicken, white fish, beans), and whole grains. Limit amount of fast food and fried food. Be sure to drink 64oz of water every day. Be sure to exercise for 30 minutes a day, 5 days a week. Exercise should be moderate in intensity and increase your heart rate.   Please schedule a lab only appointment in 6 months to recheck cholesterol and sugar level. No food 4 hours prior, water and black coffee only.   It has been a pleasure seeing you today. Rebecka ApleyJoshua M Chantry Headen, RN, Adult-Geriatric Nurse Practitioner Student and Mayra ReelKate Clark, AGNP   Preventing Complications From Unhealthy Eating Behaviors, Adult Eating behaviors are influenced by many social, emotional, and psychological factors. Everyone has some unhealthy eating behaviors. These could be eating too much or too little, eating unhealthy foods, or eating at the wrong times. If you also struggle with weight management, it may be even harder to change patterns of irregular and unhealthy eating (disordered eating). Being overweight and having unhealthy eating behaviors may lead to dangerous health problems. Unhealthy eating behaviors may also be signs that you have a type of mental health issue that causes problems with healthy eating or weight regulation (eating disorder). What nutrition changes can be made? You can start changing unhealthy eating behaviors by making different food choices. Choices that you make about what to eat and drink are very important, especially if you want to lose weight. What to avoid:  Foods that contain a lot of fat, salt (sodium), or sugar. These include candy, donuts, pizza, and fast foods.  Fried or heavily processed foods.  Drinks that contain a lot of sugar. Healthy behaviors:   Eat a variety of healthy foods, including: ? Fruits and vegetables. ? Whole grains. ? Lean proteins. ? Low-fat dairy products.  Drink water instead  of sugary drinks.  Plan healthy, low-calorie meals. Work with a Dealernutrition specialist (dietitian) to make a healthy meal plan that works for you. What lifestyle changes can be made? You can also make certain lifestyle changes to help you change unhealthy eating behaviors. What to avoid:  Eating when you are: ? Not hungry. ? Bored. ? Stressed. ? Doing another activity, like watching television.  Eating late at night.  Following a diet that restricts entire types of food.  Skipping meals to save calories. It is especially important to eat breakfast.  Not eating anything for long periods of time (fasting).  Restricting your calories to far less than the amount that you need to lose or maintain a healthy weight.  Compulsively getting an extreme amount of exercise.  Eating an excessive amount of food (bingeing), then making yourself vomit (purging). Healthy behaviors:   Keep a food diary to help you see patterns of unhealthy eating behaviors and what triggers them.  Work with your health care provider or a dietitian to design an exercise program that works for you. ? To maintain your weight, get at least 150 minutes of moderate-intensity exercise every week. Moderate-intensity exercise could be brisk walking or biking. ? To lose a healthy amount of weight, get 60 minutes of moderate-intensity exercise each day.  Plan your meals ahead of time and prepare them at home.  Find ways to reduce stress, such as regular exercise or meditation.  Find a hobby or other activity that you enjoy to distract you from eating when you feel stressed or bored.  Eat your food slowly, and avoid distractions  such as watching TV while you eat.  Get enough sleep each night.  Give yourself time to replace unhealthy eating behaviors with healthy ones. Why are these changes important? Making these changes will improve your overall health. Maintaining a healthy weight also lowers your risk of certain  conditions, including:  Heart disease.  High cholesterol.  High blood pressure.  Type 2 diabetes.  Stroke.  Osteoarthritis.  Osteoporosis.  Some types of cancer.  Breathing and sleeping disorders.  What can happen if changes are not made? You could develop health problems if you do not make these changes. Unhealthy eating behaviors can also cause you to be overweight or obese, which can increase your risk of:  Heart disease.  High blood pressure.  Type 2 diabetes.  Some types of cancers.  Using disordered eating or other unhealthy eating behaviors to try to lose weight can cause:  Fatigue.  Gastrointestinal problems.  Dehydration.  Imbalances in body fluids.  Low heart rate and blood pressure.  Thin bones that break easily.  Social isolation or relationship problems with your friends and family.  Emotional distress, including depression and anxiety.  A greater risk of an eating disorder.  If you develop an eating disorder, you could develop serious health problems and complications that affect yourorgans and bodily processes. These include:  Dry skin and hair.  Hair loss.  Fainting.  Difficulty getting pregnant.  Changes in your heart muscle and the way your heart works.  Severe dehydration that can lead to kidney failure.  Long-term (chronic) gastrointestinal problems.  High blood pressure.  High cholesterol.  Heart disease.  Type 2 diabetes.  Where to find support: For more support, talk with:  Your health care provider or dietitian. Ask about support groups.  A mental health care provider.  Family and friends.  Where to find more information:  Learn more about how to prevent complications from unhealthy eating behaviors from:  https://www.bernard.org/: https://ball-collins.biz/  Centers for Disease Control and Prevention: VisitYa.tn.html  [employer] of Mental Health:  InsuranceStats.ca.shtml  National Eating Disorders Association: www.nationaleatingdisorders.org  Contact a health care provider if:  You often feel very tired.  You notice changes in your skin or your hair.  You faint because you have not eaten enough.  You struggle to change your unhealthy eating behaviors on your own.  Unhealthy eating behaviors are affecting your daily life.  You have signs or symptoms of an eating disorder.  You have major weight changes in a short period of time.  You feel guilty or ashamed about eating.  You have trouble with your relationships because of your eating habits. Summary  Unhealthy eating behaviors could be eating too much or too little, eating unhealthy foods, or eating at the wrong times.  You can improve your eating habits and help prevent health problems by choosing healthy foods, getting enough calories every day, and exercising regularly.  If you cannot make these changes by yourself, or if you think that you may have an eating disorder, contact your health care provider. This information is not intended to replace advice given to you by your health care provider. Make sure you discuss any questions you have with your health care provider. Document Released: 07/19/2015 Document Revised: 01/22/2016 Document Reviewed: 07/19/2015 Elsevier Interactive Patient Education  2018 [employer].   Food Choices for Gastroesophageal Reflux Disease, Adult When you have gastroesophageal reflux disease (GERD), the foods you eat and your eating habits are very important. Choosing the right foods can help  ease your discomfort. What guidelines do I need to follow?  Choose fruits, vegetables, whole grains, and low-fat dairy products.  Choose low-fat meat, fish, and poultry.  Limit fats such as oils, salad dressings, butter, nuts, and avocado.  Keep a food diary. This helps you identify foods that  cause symptoms.  Avoid foods that cause symptoms. These may be different for everyone.  Eat small meals often instead of 3 large meals a day.  Eat your meals slowly, in a place where you are relaxed.  Limit fried foods.  Cook foods using methods other than frying.  Avoid drinking alcohol.  Avoid drinking large amounts of liquids with your meals.  Avoid bending over or lying down until 2-3 hours after eating. What foods are not recommended? These are some foods and drinks that may make your symptoms worse: Vegetables Tomatoes. Tomato juice. Tomato and spaghetti sauce. Chili peppers. Onion and garlic. Horseradish. Fruits Oranges, grapefruit, and lemon (fruit and juice). Meats High-fat meats, fish, and poultry. This includes hot dogs, ribs, ham, sausage, salami, and bacon. Dairy Whole milk and chocolate milk. Sour cream. Cream. Butter. Ice cream. Cream cheese. Drinks Coffee and tea. Bubbly (carbonated) drinks or energy drinks. Condiments Hot sauce. Barbecue sauce. Sweets/Desserts Chocolate and cocoa. Donuts. Peppermint and spearmint. Fats and Oils High-fat foods. This includes [place] fries and potato chips. Other Vinegar. Strong spices. This includes black pepper, white pepper, red pepper, cayenne, curry powder, cloves, ginger, and chili powder. The items listed above may not be a complete list of foods and drinks to avoid. Contact your dietitian for more information. This information is not intended to replace advice given to you by your health care provider. Make sure you discuss any questions you have with your health care provider. Document Released: 01/03/2012 Document Revised: 12/10/2015 Document Reviewed: 05/08/2013 Elsevier Interactive Patient Education  2017 [employer].

## 2017-11-10 NOTE — Addendum Note (Signed)
Addended by: Tawnya CrookSAMBATH, Hailly Fess on: 11/10/2017 03:59 PM   Modules accepted: Orders

## 2017-11-10 NOTE — Progress Notes (Signed)
Subjective:    Patient ID: Austin Benson, male    DOB: December 27, 1970, 47 y.o.   MRN: [id number]  HPI  Austin Benson is a 47 year old male who presents today for complete physical.  Immunizations: -Tetanus: Due today.   Diet: He endorses a fair diet Breakfast: Skips Lunch: Sandwich, chips Dinner: Restaurants (2-3 nights weekly), chicken, fried fish, fries, potatoes, green beans Snacks: Crackers Desserts: Cakes daily, sometimes other desserts Beverages: Coffee (black), diet soda, water  Exercise: He is not currently exercising  Eye exam: Due today Dental exam: Completes semi-annually   The 10-year ASCVD risk score Denman George(Goff DC Jr., et al., 2013) is: 5%   Values used to calculate the score:     Age: [age] years     Sex: Male     Is Non-Hispanic African American: No     Diabetic: No     Tobacco smoker: No     Systolic Blood Pressure: 126 mmHg     Is BP treated: Yes     HDL Cholesterol: 38 mg/dL     Total Cholesterol: 219 mg/dL    Review of Systems  Constitutional: Negative for unexpected weight change.  HENT: Negative for rhinorrhea.   Respiratory: Negative for cough and shortness of breath.   Cardiovascular: Negative for chest pain.  Gastrointestinal: Negative for constipation and diarrhea.  Genitourinary: Negative for difficulty urinating.  Musculoskeletal: Negative for arthralgias and myalgias.  Skin: Negative for rash.  Allergic/Immunologic: Negative for environmental allergies.  Neurological: Negative for dizziness, numbness and headaches.  Psychiatric/Behavioral: The patient is not nervous/anxious.        Past Medical History:  Diagnosis Date  . Chickenpox   . Hyperlipidemia      Social History   Socioeconomic History  . Marital status: Married    Spouse name: Not on file  . Number of children: Not on file  . Years of education: Not on file  . Highest education level: Not on file  Occupational History  . Not on file  Social Needs  . Financial resource  strain: Not on file  . Food insecurity:    Worry: Not on file    Inability: Not on file  . Transportation needs:    Medical: Not on file    Non-medical: Not on file  Tobacco Use  . Smoking status: Former Games developermoker  . Smokeless tobacco: Never Used  Substance and Sexual Activity  . Alcohol use: No    Alcohol/week: 0.0 oz  . Drug use: Not on file  . Sexual activity: Not on file  Lifestyle  . Physical activity:    Days per week: Not on file    Minutes per session: Not on file  . Stress: Not on file  Relationships  . Social connections:    Talks on phone: Not on file    Gets together: Not on file    Attends religious service: Not on file    Active member of club or organization: Not on file    Attends meetings of clubs or organizations: Not on file    Relationship status: Not on file  . Intimate partner violence:    Fear of current or ex partner: Not on file    Emotionally abused: Not on file    Physically abused: Not on file    Forced sexual activity: Not on file  Other Topics Concern  . Not on file  Social History Narrative   Married.   2 children, 3 grandchildren.   Work's  as a Chartered certified accountant.    Enjoys hunting, fishing.     No past surgical history on file.  Family History  Problem Relation Age of Onset  . Arthritis Mother   . Diabetes Father   . Diabetes Maternal Grandmother   . Arthritis Maternal Grandmother   . Diabetes Maternal Grandfather   . Heart attack Father 69    No Known Allergies  No current outpatient medications on file prior to visit.   No current facility-administered medications on file prior to visit.     BP 126/82   Pulse 81   Temp 98.6 F (37 C) (Oral)   Ht 5' 9.25" (1.759 m)   Wt 225 lb 8 oz (102.3 kg)   SpO2 97%   BMI 33.06 kg/m    Objective:   Physical Exam  Constitutional: He is oriented to person, place, and time. He appears well-nourished.  HENT:  Right Ear: Tympanic membrane and ear canal normal.  Left Ear: Tympanic  membrane and ear canal normal.  Nose: Nose normal. Right sinus exhibits no maxillary sinus tenderness and no frontal sinus tenderness. Left sinus exhibits no maxillary sinus tenderness and no frontal sinus tenderness.  Mouth/Throat: Oropharynx is clear and moist.  Eyes: Pupils are equal, round, and reactive to light. Conjunctivae and EOM are normal.  Neck: Neck supple. Carotid bruit is not present. No thyromegaly present.  Cardiovascular: Normal rate, regular rhythm and normal heart sounds.  Pulmonary/Chest: Effort normal and breath sounds normal. He has no wheezes. He has no rales.  Abdominal: Soft. Bowel sounds are normal. There is no tenderness.  Musculoskeletal: Normal range of motion.  Neurological: He is alert and oriented to person, place, and time. He has normal reflexes. No cranial nerve deficit.  Skin: Skin is warm and dry.  Psychiatric: He has a normal mood and affect.          Assessment & Plan:

## 2017-11-10 NOTE — Assessment & Plan Note (Signed)
Stable in the office today. Continue lisinopril. BMP unremarkable.

## 2017-11-10 NOTE — Progress Notes (Signed)
Subjective:    Patient ID: Austin Benson, male    DOB: October 06, 1970, 47 y.o.   MRN: [id number]  HPI Austin Benson is a 47 y.o. male who presents today for his annual wellness exam. HPI and ROS completed by Mayra ReelKate Clark, AGNP.    Past Medical History:  Diagnosis Date  . Chickenpox   . Hyperlipidemia     No past surgical history on file. Social History   Socioeconomic History  . Marital status: Married    Spouse name: Not on file  . Number of children: Not on file  . Years of education: Not on file  . Highest education level: Not on file  Occupational History  . Not on file  Social Needs  . Financial resource strain: Not on file  . Food insecurity:    Worry: Not on file    Inability: Not on file  . Transportation needs:    Medical: Not on file    Non-medical: Not on file  Tobacco Use  . Smoking status: Former Games developermoker  . Smokeless tobacco: Never Used  Substance and Sexual Activity  . Alcohol use: No    Alcohol/week: 0.0 oz  . Drug use: Not on file  . Sexual activity: Not on file  Lifestyle  . Physical activity:    Days per week: Not on file    Minutes per session: Not on file  . Stress: Not on file  Relationships  . Social connections:    Talks on phone: Not on file    Gets together: Not on file    Attends religious service: Not on file    Active member of club or organization: Not on file    Attends meetings of clubs or organizations: Not on file    Relationship status: Not on file  . Intimate partner violence:    Fear of current or ex partner: Not on file    Emotionally abused: Not on file    Physically abused: Not on file    Forced sexual activity: Not on file  Other Topics Concern  . Not on file  Social History Narrative   Married.   2 children, 3 grandchildren.   Work's as a Chartered certified accountantmachinist.    Enjoys hunting, fishing.    Family History  Problem Relation Age of Onset  . Arthritis Mother   . Diabetes Father   . Diabetes Maternal Grandmother   . Arthritis  Maternal Grandmother   . Diabetes Maternal Grandfather   . Heart attack Father [id number]   Current Outpatient Medications on File Prior to Visit  Medication Sig Dispense Refill  . lisinopril (PRINIVIL,ZESTRIL) 10 MG tablet Take 1 tablet (10 mg total) by mouth daily. 90 tablet 0  . omeprazole (PRILOSEC) 40 MG capsule Take 1 capsule (40 mg total) by mouth daily. 90 capsule 0   No current facility-administered medications on file prior to visit.     Objective:   Physical Exam  Constitutional: He is oriented to person, place, and time. Vital signs are normal. He appears well-nourished. No distress.  HENT:  Head: Normocephalic.  Right Ear: Hearing, tympanic membrane, external ear and ear canal normal.  Left Ear: Hearing, tympanic membrane, external ear and ear canal normal.  Nose: Nose normal. No mucosal edema, nasal deformity or septal deviation.  Mouth/Throat: Oropharynx is clear and moist and mucous membranes are normal.  Eyes: Conjunctivae and EOM are normal.  Neck: Trachea normal. Neck supple. No JVD present. No thyroid mass and no thyromegaly present.  Cardiovascular: Normal rate, regular rhythm, S1 normal, S2 normal, normal heart sounds and normal pulses. Exam reveals no gallop and no friction rub.  No murmur heard. Pulmonary/Chest: Effort normal and breath sounds normal.  Abdominal: Soft. Normal appearance and bowel sounds are normal. He exhibits no distension and no mass. There is hepatosplenomegaly. There is no tenderness. There is no CVA tenderness. No hernia.  Musculoskeletal: Normal range of motion.  Lymphadenopathy:    He has no cervical adenopathy.  Neurological: He is alert and oriented to person, place, and time. He has normal strength and normal reflexes. No cranial nerve deficit.  Skin: Skin is warm, dry and intact. No lesion and no rash noted.  Psychiatric: He has a normal mood and affect. His speech is normal and behavior is normal.    BP 126/82   Pulse 81   Temp 98.6 F  (37 C) (Oral)   Ht 5' 9.25" (1.759 m)   Wt 225 lb 8 oz (102.3 kg)   SpO2 97%   BMI 33.06 kg/m   BP Readings from Last 3 Encounters:  11/10/17 126/82  12/26/16 136/84  06/20/16 140/88   Wt Readings from Last 3 Encounters:  11/10/17 225 lb 8 oz (102.3 kg)  12/26/16 230 lb 1.9 oz (104.4 kg)  06/20/16 228 lb 12.8 oz (103.8 kg)      Assessment & Plan:   1. Essential hypertension  - lisinopril (PRINIVIL,ZESTRIL) 10 MG tablet; Take 1 tablet by mouth once daily for blood pressure.  Dispense: 90 tablet; Refill: 3  2. Gastroesophageal reflux disease, esophagitis presence not specified  - omeprazole (PRILOSEC) 40 MG capsule; Take 1 tablet by mouth once daily for heartburn.  Dispense: 90 capsule; Refill: 3  3. Preventative health care - Td today in office   4. Mixed hyperlipidemia - Follow up in 6months with labs -diet education provided  5. Prediabetes - Follow-up in 6months with labs - diet education provided  Rebecka Apley, RN, Adult-Geriatric Nurse Practitioner Student

## 2017-11-10 NOTE — Assessment & Plan Note (Signed)
Experienced esophageal reflux and burning on omeprazole 20 mg, will continue at 40 mg dose. Discussed trigger foods for GERD.

## 2018-04-27 DIAGNOSIS — Z23 Encounter for immunization: Secondary | ICD-10-CM | POA: Diagnosis not present

## 2018-05-08 ENCOUNTER — Other Ambulatory Visit: Payer: Self-pay | Admitting: Primary Care

## 2018-05-08 DIAGNOSIS — E782 Mixed hyperlipidemia: Secondary | ICD-10-CM

## 2018-05-08 DIAGNOSIS — R7303 Prediabetes: Secondary | ICD-10-CM

## 2018-05-16 ENCOUNTER — Other Ambulatory Visit: Payer: BLUE CROSS/BLUE SHIELD

## 2018-06-12 DIAGNOSIS — D485 Neoplasm of uncertain behavior of skin: Secondary | ICD-10-CM | POA: Diagnosis not present

## 2018-06-12 DIAGNOSIS — D2339 Other benign neoplasm of skin of other parts of face: Secondary | ICD-10-CM | POA: Diagnosis not present

## 2018-10-29 ENCOUNTER — Other Ambulatory Visit: Payer: Self-pay | Admitting: Primary Care

## 2018-10-29 DIAGNOSIS — K219 Gastro-esophageal reflux disease without esophagitis: Secondary | ICD-10-CM

## 2018-11-05 ENCOUNTER — Other Ambulatory Visit: Payer: Self-pay | Admitting: Primary Care

## 2018-11-05 DIAGNOSIS — I10 Essential (primary) hypertension: Secondary | ICD-10-CM

## 2018-11-06 ENCOUNTER — Other Ambulatory Visit: Payer: Self-pay | Admitting: Primary Care

## 2018-11-06 DIAGNOSIS — I10 Essential (primary) hypertension: Secondary | ICD-10-CM

## 2019-01-28 ENCOUNTER — Other Ambulatory Visit: Payer: Self-pay | Admitting: Primary Care

## 2019-01-28 DIAGNOSIS — K219 Gastro-esophageal reflux disease without esophagitis: Secondary | ICD-10-CM

## 2019-02-21 ENCOUNTER — Other Ambulatory Visit: Payer: Self-pay

## 2019-02-21 ENCOUNTER — Other Ambulatory Visit (INDEPENDENT_AMBULATORY_CARE_PROVIDER_SITE_OTHER): Payer: BC Managed Care – PPO

## 2019-02-21 DIAGNOSIS — R7303 Prediabetes: Secondary | ICD-10-CM

## 2019-02-21 DIAGNOSIS — E782 Mixed hyperlipidemia: Secondary | ICD-10-CM

## 2019-02-21 LAB — LIPID PANEL
Cholesterol: 192 mg/dL (ref 0–200)
HDL: 38.1 mg/dL — ABNORMAL LOW (ref >39.00)
Total CHOL/HDL Ratio: 5
Triglycerides: 420 mg/dL — ABNORMAL HIGH (ref 0.0–149.0)

## 2019-02-21 LAB — LDL CHOLESTEROL, DIRECT: Direct LDL: 93 mg/dL

## 2019-02-21 LAB — HEMOGLOBIN A1C: Hgb A1c MFr Bld: 6.5 % (ref 4.6–6.5)

## 2019-02-22 ENCOUNTER — Other Ambulatory Visit: Payer: BLUE CROSS/BLUE SHIELD

## 2019-02-27 ENCOUNTER — Other Ambulatory Visit: Payer: Self-pay

## 2019-02-27 ENCOUNTER — Ambulatory Visit (INDEPENDENT_AMBULATORY_CARE_PROVIDER_SITE_OTHER): Payer: BC Managed Care – PPO | Admitting: Primary Care

## 2019-02-27 ENCOUNTER — Encounter: Payer: Self-pay | Admitting: Primary Care

## 2019-02-27 VITALS — BP 130/76 | HR 83 | Temp 98.5°F | Ht 69.25 in | Wt 238.2 lb

## 2019-02-27 DIAGNOSIS — E782 Mixed hyperlipidemia: Secondary | ICD-10-CM

## 2019-02-27 DIAGNOSIS — Z125 Encounter for screening for malignant neoplasm of prostate: Secondary | ICD-10-CM

## 2019-02-27 DIAGNOSIS — I10 Essential (primary) hypertension: Secondary | ICD-10-CM | POA: Diagnosis not present

## 2019-02-27 DIAGNOSIS — Z0000 Encounter for general adult medical examination without abnormal findings: Secondary | ICD-10-CM

## 2019-02-27 DIAGNOSIS — E119 Type 2 diabetes mellitus without complications: Secondary | ICD-10-CM | POA: Diagnosis not present

## 2019-02-27 DIAGNOSIS — K219 Gastro-esophageal reflux disease without esophagitis: Secondary | ICD-10-CM | POA: Diagnosis not present

## 2019-02-27 DIAGNOSIS — Z23 Encounter for immunization: Secondary | ICD-10-CM | POA: Diagnosis not present

## 2019-02-27 LAB — COMPREHENSIVE METABOLIC PANEL
ALT: 41 U/L (ref 0–53)
AST: 29 U/L (ref 0–37)
Albumin: 4.8 g/dL (ref 3.5–5.2)
Alkaline Phosphatase: 88 U/L (ref 39–117)
BUN: 18 mg/dL (ref 6–23)
CO2: 26 mEq/L (ref 19–32)
Calcium: 9.7 mg/dL (ref 8.4–10.5)
Chloride: 100 mEq/L (ref 96–112)
Creatinine, Ser: 0.92 mg/dL (ref 0.40–1.50)
GFR: 87.03 mL/min (ref >60.00)
Glucose, Bld: 125 mg/dL — ABNORMAL HIGH (ref 70–99)
Potassium: 4.2 mEq/L (ref 3.5–5.1)
Sodium: 137 mEq/L (ref 135–145)
Total Bilirubin: 1.1 mg/dL (ref 0.2–1.2)
Total Protein: 7.1 g/dL (ref 6.0–8.3)

## 2019-02-27 LAB — CBC
HCT: 45.9 % (ref 39.0–52.0)
Hemoglobin: 15.2 g/dL (ref 13.0–17.0)
MCHC: 33.2 g/dL (ref 30.0–36.0)
MCV: 94.5 fl (ref 78.0–100.0)
Platelets: 256 10*3/uL (ref 150.0–400.0)
RBC: 4.85 Mil/uL (ref 4.22–5.81)
RDW: 13 % (ref 11.5–15.5)
WBC: 8.1 10*3/uL (ref 4.0–10.5)

## 2019-02-27 LAB — PSA: PSA: 0.76 ng/mL (ref 0.10–4.00)

## 2019-02-27 MED ORDER — ATORVASTATIN CALCIUM 40 MG PO TABS: 40.0000 mg | ORAL_TABLET | Freq: Every day | ORAL | 3 refills | Status: DC

## 2019-02-27 MED ORDER — LISINOPRIL 10 MG PO TABS: 10.0000 mg | ORAL_TABLET | Freq: Every day | ORAL | 3 refills | Status: DC

## 2019-02-27 MED ORDER — METFORMIN HCL 500 MG PO TABS: 500.0000 mg | ORAL_TABLET | Freq: Two times a day (BID) | ORAL | 3 refills | Status: DC

## 2019-02-27 MED ORDER — OMEPRAZOLE 40 MG PO CPDR: 40.0000 mg | DELAYED_RELEASE_CAPSULE | Freq: Every day | ORAL | 3 refills | Status: DC

## 2019-02-27 NOTE — Addendum Note (Signed)
Addended by: Jacqualin Combes on: 02/27/2019 02:22 PM   Modules accepted: Orders

## 2019-02-27 NOTE — Assessment & Plan Note (Signed)
Recent A1C of 6.5. Strongly advised he work on his diet and start exercising.  Rx for Metformin 500 mg BID course sent to pharmacy, discussed to start with 500 mg once daily for 2 weeks then increase to BID.  Managed on ACE. Rx for atorvastatin provided today. Pneumonia vaccination completed today. He will schedule an eye exam. Foot exam today.  Follow up in 3 months for diabetes check.

## 2019-02-27 NOTE — Assessment & Plan Note (Signed)
Now with diabetes diagnosis. Triglycerides very high, LDL above goal in 2019. Rx for atorvastatin 40 mg sent to pharmacy. Repeat lipids and LFT's in 6 weeks.

## 2019-02-27 NOTE — Assessment & Plan Note (Signed)
Immunizations UTD, pneumonia vaccination provided today. PSA pending. Declines colonoscopy this year given Covid-19. Discussed the importance of a healthy diet and regular exercise in order for weight loss, and to reduce the risk of any potential medical problems. Exam unremarkable. Labs reviewed.

## 2019-02-27 NOTE — Patient Instructions (Signed)
Start metformin 500 mg tablets for diabetes. Start by taking 1 tablet once daily in the morning for two weeks, then increase to 1 tablet twice daily thereafter.  Start atorvastatin 40 mg once daily for cholesterol.   It is important that you improve your diet. Please limit carbohydrates in the form of white bread, rice, pasta, sweets, fast food, fried food, sugary drinks, etc. Increase your consumption of fresh fruits and vegetables, whole grains, lean protein.  Ensure you are consuming 64 ounces of water daily.  Schedule an eye exam and notify them that you have diabetes. This is a special exam.  Schedule a lab only appointment for 6 weeks to repeat cholesterol.  Please schedule a follow up appointment in 3 months for diabetes check.  It was a pleasure to see you today!   Type 2 Diabetes Mellitus, Diagnosis, Adult Type 2 diabetes (type 2 diabetes mellitus) is a long-term (chronic) disease. It may be caused by one or both of these problems:  Your pancreas does not make enough of a hormone called insulin.  Your body does not react in a normal way to insulin that it makes. Insulin lets sugars (glucose) go into cells in your body. This gives you energy. If you have type 2 diabetes, sugars cannot get into cells. This causes high blood sugar (hyperglycemia). Your doctor will set treatment goals for you. Generally, you should have these blood sugar levels:  Before meals (preprandial): 80-130 mg/dL (4.4-7.2 mmol/L).  After meals (postprandial): below 180 mg/dL (10 mmol/L).  A1c (hemoglobin A1c) level: less than 7%. Follow these instructions at home: Questions to ask your doctor  You may want to ask these questions: ? Do I need to meet with a diabetes educator? ? Where can I find a support group for people with diabetes? ? What equipment will I need to care for myself at home? ? What diabetes medicines do I need? When should I take them? ? How often do I need to check my blood sugar?  ? What number can I call if I have questions? ? When is my next doctor's visit? General instructions  Take over-the-counter and prescription medicines only as told by your doctor.  Keep all follow-up visits as told by your doctor. This is important. Contact a doctor if:  Your blood sugar is at or above 240 mg/dL (13.3 mmol/L) for 2 days in a row.  You have been sick for 2 days or more, and you are not getting better.  You have had a fever for 2 days or more, and you are not getting better.  You have any of these problems for more than 6 hours: ? You cannot eat or drink. ? You feel sick to your stomach (nauseous). ? You throw up (vomit). ? You have watery poop (diarrhea). Get help right away if:  Your blood sugar is lower than 54 mg/dL (3 mmol/L).  You get confused.  You have trouble: ? Thinking clearly. ? Breathing.  You have moderate or large ketone levels in your pee (urine). Summary  Type 2 diabetes is a long-term (chronic) disease. Your pancreas may not make enough of a hormone called insulin, or your body may not react normally to insulin that it makes.  Take over-the-counter and prescription medicines only as told by your doctor.  Keep all follow-up visits as told by your doctor. This is important. This information is not intended to replace advice given to you by your health care provider. Make sure you discuss  any questions you have with your health care provider. Document Released: 04/12/2008 Document Revised: 09/01/2017 Document Reviewed: 08/07/2015 Elsevier Patient Education  2020 [employer]

## 2019-02-27 NOTE — Progress Notes (Signed)
Subjective:    Patient ID: Austin Benson, male    DOB: [birth year], 48 y.o.   MRN: [id number]  HPI  Austin Benson is a 48 year old male who presents today for complete physical.  Immunizations: -Tetanus: Completed in 2019 -Influenza: Due this season  -Pneumonia: Due.  Diet:   Breakfast: Skips Lunch: Sandwich, chips, snack cakes Dinner: Take out/fast food, home cooked meals (meat, vegetable, starch, salad). Snacks: None Desserts: Daily  Beverages: Coffee, diet soda, Gatorade, water  Exercise: He is not exercising  Eye exam: Due soon Dental exam: Completes semi-annually  Colonoscopy: Never completed, declines due to Covid-19.  BP Readings from Last 3 Encounters:  02/27/19 130/76  11/10/17 126/82  12/26/16 136/84     Review of Systems  Constitutional: Negative for unexpected weight change.  HENT: Negative for rhinorrhea.   Respiratory: Negative for cough and shortness of breath.   Cardiovascular: Negative for chest pain.  Gastrointestinal: Negative for constipation and diarrhea.  Genitourinary: Negative for difficulty urinating.  Musculoskeletal: Negative for arthralgias and myalgias.  Skin: Negative for rash.  Allergic/Immunologic: Negative for environmental allergies.  Neurological: Negative for dizziness, numbness and headaches.  Psychiatric/Behavioral: The patient is not nervous/anxious.        Past Medical History:  Diagnosis Date  . Chickenpox   . Hyperlipidemia      Social History   Socioeconomic History  . Marital status: Married    Spouse name: Not on file  . Number of children: Not on file  . Years of education: Not on file  . Highest education level: Not on file  Occupational History  . Not on file  Social Needs  . Financial resource strain: Not on file  . Food insecurity    Worry: Not on file    Inability: Not on file  . Transportation needs    Medical: Not on file    Non-medical: Not on file  Tobacco Use  . Smoking status: Former  Research scientist (life sciences)  . Smokeless tobacco: Never Used  Substance and Sexual Activity  . Alcohol use: No    Alcohol/week: 0.0 standard drinks  . Drug use: Not on file  . Sexual activity: Not on file  Lifestyle  . Physical activity    Days per week: Not on file    Minutes per session: Not on file  . Stress: Not on file  Relationships  . Social Herbalist on phone: Not on file    Gets together: Not on file    Attends religious service: Not on file    Active member of club or organization: Not on file    Attends meetings of clubs or organizations: Not on file    Relationship status: Not on file  . Intimate partner violence    Fear of current or ex partner: Not on file    Emotionally abused: Not on file    Physically abused: Not on file    Forced sexual activity: Not on file  Other Topics Concern  . Not on file  Social History Narrative   Married.   2 children, 3 grandchildren.   Work's as a Furniture conservator/restorer.    Enjoys hunting, fishing.     No past surgical history on file.  Family History  Problem Relation Age of Onset  . Arthritis Mother   . Diabetes Father   . Diabetes Maternal Grandmother   . Arthritis Maternal Grandmother   . Diabetes Maternal Grandfather   . Heart attack Father 65  No Known Allergies  No current outpatient medications on file prior to visit.   No current facility-administered medications on file prior to visit.     BP 130/76   Pulse 83   Temp 98.5 F (36.9 C) (Temporal)   Ht 5' 9.25" (1.759 m)   Wt 238 lb 4 oz (108.1 kg)   SpO2 97%   BMI 34.93 kg/m    Objective:   Physical Exam  Constitutional: He is oriented to person, place, and time. He appears well-nourished.  HENT:  Mouth/Throat: No oropharyngeal exudate.  Eyes: Pupils are equal, round, and reactive to light. EOM are normal.  Neck: Neck supple. No thyromegaly present.  Cardiovascular: Normal rate and regular rhythm.  Respiratory: Effort normal and breath sounds normal.  GI: Soft.  Bowel sounds are normal. There is no abdominal tenderness.  Musculoskeletal: Normal range of motion.  Neurological: He is alert and oriented to person, place, and time.  Skin: Skin is warm and dry.  Psychiatric: He has a normal mood and affect.           Assessment & Plan:

## 2019-02-27 NOTE — Assessment & Plan Note (Signed)
Compliant to omeprazole 40 mg and doing well. Some breakthrough symptoms with daily medication. Continue same.

## 2019-02-27 NOTE — Assessment & Plan Note (Signed)
Stable in the office today, compliant to lisinopril 10 mg daily. BMP reviewed.

## 2019-04-27 ENCOUNTER — Other Ambulatory Visit: Payer: Self-pay | Admitting: Primary Care

## 2019-04-27 DIAGNOSIS — K219 Gastro-esophageal reflux disease without esophagitis: Secondary | ICD-10-CM

## 2019-05-06 ENCOUNTER — Other Ambulatory Visit: Payer: Self-pay | Admitting: Primary Care

## 2019-05-06 DIAGNOSIS — I10 Essential (primary) hypertension: Secondary | ICD-10-CM

## 2019-05-15 DIAGNOSIS — Z23 Encounter for immunization: Secondary | ICD-10-CM | POA: Diagnosis not present

## 2019-06-05 DIAGNOSIS — I10 Essential (primary) hypertension: Secondary | ICD-10-CM

## 2019-06-06 MED ORDER — LISINOPRIL 10 MG PO TABS: 10.0000 mg | ORAL_TABLET | Freq: Every day | ORAL | 3 refills | Status: DC

## 2019-07-04 DIAGNOSIS — E119 Type 2 diabetes mellitus without complications: Secondary | ICD-10-CM | POA: Diagnosis not present

## 2019-07-05 LAB — HM DIABETES EYE EXAM

## 2019-07-18 ENCOUNTER — Encounter: Payer: Self-pay | Admitting: Primary Care

## 2019-08-12 ENCOUNTER — Telehealth: Payer: BC Managed Care – PPO | Admitting: Primary Care

## 2019-09-17 ENCOUNTER — Ambulatory Visit (INDEPENDENT_AMBULATORY_CARE_PROVIDER_SITE_OTHER): Payer: BC Managed Care – PPO | Admitting: Primary Care

## 2019-09-17 ENCOUNTER — Other Ambulatory Visit: Payer: Self-pay

## 2019-09-17 DIAGNOSIS — R05 Cough: Secondary | ICD-10-CM

## 2019-09-17 DIAGNOSIS — I10 Essential (primary) hypertension: Secondary | ICD-10-CM | POA: Diagnosis not present

## 2019-09-17 DIAGNOSIS — K219 Gastro-esophageal reflux disease without esophagitis: Secondary | ICD-10-CM

## 2019-09-17 DIAGNOSIS — R059 Cough, unspecified: Secondary | ICD-10-CM

## 2019-09-17 DIAGNOSIS — R053 Chronic cough: Secondary | ICD-10-CM

## 2019-09-17 HISTORY — DX: Chronic cough: R05.3

## 2019-09-17 MED ORDER — LOSARTAN POTASSIUM 25 MG PO TABS: 25.0000 mg | ORAL_TABLET | Freq: Every day | ORAL | 3 refills | Status: DC

## 2019-09-17 NOTE — Progress Notes (Signed)
Subjective:    Patient ID: Austin Benson, male    DOB: 02/10/1971, 49 y.o.   MRN: [id number]  HPI  Virtual Visit via Video Note  I connected with Austin Benson on 09/17/19 at  3:00 PM EST by a video enabled telemedicine application and verified that I am speaking with the correct person using two identifiers.  Location: Patient: Home Provider: Office   I discussed the limitations of evaluation and management by telemedicine and the availability of in person appointments. The patient expressed understanding and agreed to proceed.  History of Present Illness:  Austin Benson is a 49 year old male with a history of hypertension managed on ACE-I, GERD, hyperlipidemia who presents today with a chief complaint of cough.  Chronic intermittent cough that dates back to Christmas 2020. Initially thought it was secondary to his GERD so he started taking omeprazole 80 mg for about one week with resolve in cough. He reduced back down to 40 mg in early January 2021 and has been doing well until about two weeks ago. Two weeks ago his cough returned, occurs when he eats, when talking a lot, will get coughing spells. Feels exactly like his cough from December 2020. Cough will occur during the night.   His typical GERD symptoms include throat burning and fullness, some cough.   Observations/Objective:  Alert and oriented. Appears well, not sickly. No distress. Speaking in complete sentences. No cough during visit.  Assessment and Plan:  See problem based charting.  Follow Up Instructions:  Stop lisinopril 10 mg. Start losartan 25 mg.  Continue omeprazole at 40 mg daily for heartburn.  Please update me regarding your symptoms and blood pressure in 3 weeks as discussed.  It was a pleasure to see you today! Mayra Reel, NP-C    I discussed the assessment and treatment plan with the patient. The patient was provided an opportunity to ask questions and all were answered. The patient agreed with  the plan and demonstrated an understanding of the instructions.   The patient was advised to call back or seek an in-person evaluation if the symptoms worsen or if the condition fails to improve as anticipated.    Doreene Nest, NP    Review of Systems  Constitutional: Negative for chills and fever.  Respiratory: Positive for cough. Negative for shortness of breath.   Cardiovascular: Negative for chest pain.  Gastrointestinal:       Intermittent esophageal reflux.       Past Medical History:  Diagnosis Date  . Chickenpox   . Hyperlipidemia      Social History   Socioeconomic History  . Marital status: Married    Spouse name: Not on file  . Number of children: Not on file  . Years of education: Not on file  . Highest education level: Not on file  Occupational History  . Not on file  Tobacco Use  . Smoking status: Former Games developer  . Smokeless tobacco: Never Used  Substance and Sexual Activity  . Alcohol use: No    Alcohol/week: 0.0 standard drinks  . Drug use: Not on file  . Sexual activity: Not on file  Other Topics Concern  . Not on file  Social History Narrative   Married.   2 children, 3 grandchildren.   Work's as a Chartered certified accountant.    Enjoys hunting, fishing.    Social Determinants of Health   Financial Resource Strain:   . Difficulty of Paying Living Expenses: Not on file  Food  Insecurity:   . Worried About Charity fundraiser in the Last Year: Not on file  . Ran Out of Food in the Last Year: Not on file  Transportation Needs:   . Lack of Transportation (Medical): Not on file  . Lack of Transportation (Non-Medical): Not on file  Physical Activity:   . Days of Exercise per Week: Not on file  . Minutes of Exercise per Session: Not on file  Stress:   . Feeling of Stress : Not on file  Social Connections:   . Frequency of Communication with Friends and Family: Not on file  . Frequency of Social Gatherings with Friends and Family: Not on file  . Attends  Religious Services: Not on file  . Active Member of Clubs or Organizations: Not on file  . Attends Archivist Meetings: Not on file  . Marital Status: Not on file  Intimate Partner Violence:   . Fear of Current or Ex-Partner: Not on file  . Emotionally Abused: Not on file  . Physically Abused: Not on file  . Sexually Abused: Not on file    No past surgical history on file.  Family History  Problem Relation Age of Onset  . Arthritis Mother   . Diabetes Father   . Diabetes Maternal Grandmother   . Arthritis Maternal Grandmother   . Diabetes Maternal Grandfather   . Heart attack Father 45    No Known Allergies  Current Outpatient Medications on File Prior to Visit  Medication Sig Dispense Refill  . atorvastatin (LIPITOR) 40 MG tablet Take 1 tablet (40 mg total) by mouth daily. For cholesterol. 90 tablet 3  . metFORMIN (GLUCOPHAGE) 500 MG tablet Take 1 tablet (500 mg total) by mouth 2 (two) times daily with a meal. For diabetes. 180 tablet 3  . omeprazole (PRILOSEC) 40 MG capsule Take 1 capsule (40 mg total) by mouth daily. For Heartburn 90 capsule 2   No current facility-administered medications on file prior to visit.    There were no vitals taken for this visit.   Objective:   Physical Exam  Constitutional: He is oriented to person, place, and time. He appears well-nourished.  Respiratory: Effort normal.  No cough during visit.  Neurological: He is alert and oriented to person, place, and time.  Psychiatric: He has a normal mood and affect.           Assessment & Plan:

## 2019-09-17 NOTE — Assessment & Plan Note (Signed)
Overall seems to be doing well on PPI. Discussed to avoid doses of > 40mg .  Discontinuing ACE-I with hopes of resolution in cough.

## 2019-09-17 NOTE — Patient Instructions (Signed)
Stop lisinopril 10 mg. Start losartan 25 mg.  Continue omeprazole at 40 mg daily for heartburn.  Please update me regarding your symptoms and blood pressure in 3 weeks as discussed.  It was a pleasure to see you today! Mayra Reel, NP-C

## 2019-09-17 NOTE — Assessment & Plan Note (Signed)
Discontinuing ACE for suspected ACE-I induced cough. Starting ARB for renal protection against diabetes.  He will update with BP in 2-3 weeks.

## 2019-09-17 NOTE — Assessment & Plan Note (Signed)
Acute since Christmas, intermittent.  Suspect cough is secondary to ACE-I. Also suspect that ACE induced cough is aggravating reflux.   Stop lisinopril. Start losartan. Continue PPI.  He will update with symptoms and BP in 2-3 weeks.

## 2019-10-11 ENCOUNTER — Ambulatory Visit: Payer: BC Managed Care – PPO | Attending: Internal Medicine

## 2019-10-11 DIAGNOSIS — Z23 Encounter for immunization: Secondary | ICD-10-CM

## 2019-10-11 NOTE — Progress Notes (Signed)
   Covid-19 Vaccination Clinic  Name:  Odai Wimmer    MRN: [id number] DOB: 11/29/1970  10/11/2019  Mr. Shankar was observed post Covid-19 immunization for 15 minutes without incident. He was provided with Vaccine Information Sheet and instruction to access the V-Safe system.   Mr. Cartmell was instructed to call 911 with any severe reactions post vaccine: Marland Kitchen Difficulty breathing  . Swelling of face and throat  . A fast heartbeat  . A bad rash all over body  . Dizziness and weakness   Immunizations Administered    Name Date Dose VIS Date Route   Pfizer COVID-19 Vaccine 10/11/2019  8:50 AM 0.3 mL 06/28/2019 Intramuscular   Manufacturer: [employer], [employer]   Lot: [id number]   NDC: [address]-8

## 2019-10-14 DIAGNOSIS — I10 Essential (primary) hypertension: Secondary | ICD-10-CM

## 2019-10-14 MED ORDER — AMLODIPINE BESYLATE 5 MG PO TABS: 5.0000 mg | ORAL_TABLET | Freq: Every day | ORAL | 0 refills | Status: DC

## 2019-11-06 ENCOUNTER — Ambulatory Visit: Payer: BC Managed Care – PPO | Attending: Internal Medicine

## 2019-11-06 DIAGNOSIS — Z23 Encounter for immunization: Secondary | ICD-10-CM

## 2019-11-06 NOTE — Progress Notes (Signed)
   Covid-19 Vaccination Clinic  Name:  Austin Benson    MRN: [id number] DOB: 1970-12-04  11/06/2019  Austin Benson was observed post Covid-19 immunization for 15 minutes without incident. He was provided with Vaccine Information Sheet and instruction to access the V-Safe system.   Austin Benson was instructed to call 911 with any severe reactions post vaccine: Marland Kitchen Difficulty breathing  . Swelling of face and throat  . A fast heartbeat  . A bad rash all over body  . Dizziness and weakness   Immunizations Administered    Name Date Dose VIS Date Route   Pfizer COVID-19 Vaccine 11/06/2019  8:48 AM 0.3 mL 09/11/2018 Intramuscular   Manufacturer: [employer], [employer]   Lot: [id number]   NDC: [address]-1

## 2019-12-25 ENCOUNTER — Other Ambulatory Visit: Payer: Self-pay | Admitting: Primary Care

## 2019-12-25 DIAGNOSIS — I10 Essential (primary) hypertension: Secondary | ICD-10-CM

## 2019-12-27 NOTE — Telephone Encounter (Signed)
Refills sent to pharmacy. 

## 2019-12-27 NOTE — Telephone Encounter (Signed)
Last prescribed on 10/14/2019 Last OV (follow up ) with Mayra Reel on 09/17/2019  No future OV scheduled

## 2020-01-25 ENCOUNTER — Other Ambulatory Visit: Payer: Self-pay | Admitting: Primary Care

## 2020-01-25 DIAGNOSIS — K219 Gastro-esophageal reflux disease without esophagitis: Secondary | ICD-10-CM

## 2020-01-31 ENCOUNTER — Encounter: Payer: Self-pay | Admitting: Primary Care

## 2020-01-31 ENCOUNTER — Other Ambulatory Visit: Payer: Self-pay | Admitting: Primary Care

## 2020-01-31 DIAGNOSIS — E119 Type 2 diabetes mellitus without complications: Secondary | ICD-10-CM

## 2020-01-31 DIAGNOSIS — Z114 Encounter for screening for human immunodeficiency virus [HIV]: Secondary | ICD-10-CM

## 2020-01-31 DIAGNOSIS — E782 Mixed hyperlipidemia: Secondary | ICD-10-CM

## 2020-01-31 DIAGNOSIS — I10 Essential (primary) hypertension: Secondary | ICD-10-CM

## 2020-01-31 DIAGNOSIS — Z1159 Encounter for screening for other viral diseases: Secondary | ICD-10-CM

## 2020-02-04 ENCOUNTER — Other Ambulatory Visit: Payer: Self-pay | Admitting: Primary Care

## 2020-02-04 DIAGNOSIS — E119 Type 2 diabetes mellitus without complications: Secondary | ICD-10-CM

## 2020-02-04 DIAGNOSIS — E782 Mixed hyperlipidemia: Secondary | ICD-10-CM

## 2020-02-20 ENCOUNTER — Other Ambulatory Visit (INDEPENDENT_AMBULATORY_CARE_PROVIDER_SITE_OTHER): Payer: BC Managed Care – PPO

## 2020-02-20 ENCOUNTER — Other Ambulatory Visit: Payer: Self-pay

## 2020-02-20 DIAGNOSIS — I10 Essential (primary) hypertension: Secondary | ICD-10-CM

## 2020-02-20 DIAGNOSIS — Z1159 Encounter for screening for other viral diseases: Secondary | ICD-10-CM

## 2020-02-20 DIAGNOSIS — E119 Type 2 diabetes mellitus without complications: Secondary | ICD-10-CM | POA: Diagnosis not present

## 2020-02-20 DIAGNOSIS — E782 Mixed hyperlipidemia: Secondary | ICD-10-CM | POA: Diagnosis not present

## 2020-02-20 DIAGNOSIS — Z114 Encounter for screening for human immunodeficiency virus [HIV]: Secondary | ICD-10-CM | POA: Diagnosis not present

## 2020-02-20 LAB — COMPREHENSIVE METABOLIC PANEL
ALT: 46 U/L (ref 0–53)
AST: 35 U/L (ref 0–37)
Albumin: 4.6 g/dL (ref 3.5–5.2)
Alkaline Phosphatase: 101 U/L (ref 39–117)
BUN: 24 mg/dL — ABNORMAL HIGH (ref 6–23)
CO2: 28 mEq/L (ref 19–32)
Calcium: 9.6 mg/dL (ref 8.4–10.5)
Chloride: 102 mEq/L (ref 96–112)
Creatinine, Ser: 1.12 mg/dL (ref 0.40–1.50)
GFR: 69.08 mL/min (ref >60.00)
Glucose, Bld: 112 mg/dL — ABNORMAL HIGH (ref 70–99)
Potassium: 3.9 mEq/L (ref 3.5–5.1)
Sodium: 138 mEq/L (ref 135–145)
Total Bilirubin: 1 mg/dL (ref 0.2–1.2)
Total Protein: 7.3 g/dL (ref 6.0–8.3)

## 2020-02-20 LAB — LIPID PANEL
Cholesterol: 129 mg/dL (ref 0–200)
HDL: 34.1 mg/dL — ABNORMAL LOW (ref >39.00)
NonHDL: 94.58
Total CHOL/HDL Ratio: 4
Triglycerides: 345 mg/dL — ABNORMAL HIGH (ref 0.0–149.0)
VLDL: 69 mg/dL — ABNORMAL HIGH (ref 0.0–40.0)

## 2020-02-20 LAB — CBC
HCT: 42.8 % (ref 39.0–52.0)
Hemoglobin: 14.6 g/dL (ref 13.0–17.0)
MCHC: 34.1 g/dL (ref 30.0–36.0)
MCV: 93.2 fl (ref 78.0–100.0)
Platelets: 269 10*3/uL (ref 150.0–400.0)
RBC: 4.59 Mil/uL (ref 4.22–5.81)
RDW: 12.9 % (ref 11.5–15.5)
WBC: 10 10*3/uL (ref 4.0–10.5)

## 2020-02-20 LAB — HEMOGLOBIN A1C: Hgb A1c MFr Bld: 7 % — ABNORMAL HIGH (ref 4.6–6.5)

## 2020-02-20 LAB — LDL CHOLESTEROL, DIRECT: Direct LDL: 53 mg/dL

## 2020-02-20 LAB — MICROALBUMIN / CREATININE URINE RATIO
Creatinine,U: 235.4 mg/dL
Microalb Creat Ratio: 0.8 mg/g (ref 0.0–30.0)
Microalb, Ur: 1.9 mg/dL (ref 0.0–1.9)

## 2020-02-21 LAB — HEPATITIS C ANTIBODY
Hepatitis C Ab: NONREACTIVE
SIGNAL TO CUT-OFF: 0.01 (ref <1.00)

## 2020-02-21 LAB — HIV ANTIBODY (ROUTINE TESTING W REFLEX): HIV 1&2 Ab, 4th Generation: NONREACTIVE

## 2020-02-27 ENCOUNTER — Encounter: Payer: BC Managed Care – PPO | Admitting: Primary Care

## 2020-03-05 ENCOUNTER — Encounter: Payer: Self-pay | Admitting: Primary Care

## 2020-03-05 ENCOUNTER — Ambulatory Visit (INDEPENDENT_AMBULATORY_CARE_PROVIDER_SITE_OTHER): Payer: BC Managed Care – PPO | Admitting: Primary Care

## 2020-03-05 ENCOUNTER — Other Ambulatory Visit: Payer: Self-pay

## 2020-03-05 VITALS — BP 130/82 | HR 90 | Temp 95.3°F | Ht 69.25 in | Wt 239.2 lb

## 2020-03-05 DIAGNOSIS — Z0000 Encounter for general adult medical examination without abnormal findings: Secondary | ICD-10-CM

## 2020-03-05 DIAGNOSIS — E782 Mixed hyperlipidemia: Secondary | ICD-10-CM

## 2020-03-05 DIAGNOSIS — K219 Gastro-esophageal reflux disease without esophagitis: Secondary | ICD-10-CM | POA: Diagnosis not present

## 2020-03-05 DIAGNOSIS — E119 Type 2 diabetes mellitus without complications: Secondary | ICD-10-CM

## 2020-03-05 DIAGNOSIS — I10 Essential (primary) hypertension: Secondary | ICD-10-CM | POA: Diagnosis not present

## 2020-03-05 DIAGNOSIS — R05 Cough: Secondary | ICD-10-CM

## 2020-03-05 DIAGNOSIS — Z1211 Encounter for screening for malignant neoplasm of colon: Secondary | ICD-10-CM | POA: Diagnosis not present

## 2020-03-05 DIAGNOSIS — R059 Cough, unspecified: Secondary | ICD-10-CM

## 2020-03-05 NOTE — Assessment & Plan Note (Signed)
Doing well on omeprazole for which he requires daily, continue same.

## 2020-03-05 NOTE — Assessment & Plan Note (Signed)
LDL at goal on Lipitor, continue same.

## 2020-03-05 NOTE — Assessment & Plan Note (Signed)
Well controlled on Amlodipine 5 mg, continue same.

## 2020-03-05 NOTE — Assessment & Plan Note (Signed)
Resolved since coming off of ACE-I and ARB.

## 2020-03-05 NOTE — Assessment & Plan Note (Addendum)
Overall under decent control, would like to see him below 7. He is actually taking both metformin tablets together, discussed to take these twice daily.  Managed on statin. Urine micro negative. Pneumonia vaccination UTD. Foot exam next visit.  Follow up in 6 months.

## 2020-03-05 NOTE — Progress Notes (Signed)
Subjective:    Patient ID: Austin Benson, male    DOB: 1970-11-08, 48 y.o.   MRN: [id number]  HPI  This visit occurred during the SARS-CoV-2 public health emergency.  Safety protocols were in place, including screening questions prior to the visit, additional usage of staff PPE, and extensive cleaning of exam room while observing appropriate contact time as indicated for disinfecting solutions.   Austin Benson is a 49 year old male who presents today for complete physical.  Immunizations: -Tetanus: Completed in 2019 -Influenza: Due this season  -Shingles: Due, will check on insurance -Pneumonia: Completed in 2020 -Covid-19: Completed series  Diet: He endorses a fair diet. Exercise: No recent exercise  Eye exam: UTD Dental exam: Completes semi-annually   Colonoscopy: Never completed, declines and will check on insurance. Mother had colon cancer.  PSA: 0.76 in 2020 Hep C Screen: Negative  BP Readings from Last 3 Encounters:  03/05/20 130/82  02/27/19 130/76  11/10/17 126/82      Review of Systems  Constitutional: Negative for unexpected weight change.  HENT: Negative for rhinorrhea.   Respiratory: Negative for cough and shortness of breath.   Cardiovascular: Negative for chest pain.  Gastrointestinal: Negative for constipation and diarrhea.  Genitourinary: Negative for difficulty urinating.  Musculoskeletal: Negative for arthralgias and myalgias.  Skin: Negative for rash.  Allergic/Immunologic: Negative for environmental allergies.  Neurological: Negative for dizziness, numbness and headaches.  Psychiatric/Behavioral: The patient is not nervous/anxious.        Past Medical History:  Diagnosis Date  . Chickenpox   . Hyperlipidemia      Social History   Socioeconomic History  . Marital status: Married    Spouse name: Not on file  . Number of children: Not on file  . Years of education: Not on file  . Highest education level: Not on file  Occupational History   . Not on file  Tobacco Use  . Smoking status: Former Games developer  . Smokeless tobacco: Never Used  Substance and Sexual Activity  . Alcohol use: No    Alcohol/week: 0.0 standard drinks  . Drug use: Not on file  . Sexual activity: Not on file  Other Topics Concern  . Not on file  Social History Narrative   Married.   2 children, 3 grandchildren.   Work's as a Chartered certified accountant.    Enjoys hunting, fishing.    Social Determinants of Health   Financial Resource Strain:   . Difficulty of Paying Living Expenses: Not on file  Food Insecurity:   . Worried About Programme researcher, broadcasting/film/video in the Last Year: Not on file  . Ran Out of Food in the Last Year: Not on file  Transportation Needs:   . Lack of Transportation (Medical): Not on file  . Lack of Transportation (Non-Medical): Not on file  Physical Activity:   . Days of Exercise per Week: Not on file  . Minutes of Exercise per Session: Not on file  Stress:   . Feeling of Stress : Not on file  Social Connections:   . Frequency of Communication with Friends and Family: Not on file  . Frequency of Social Gatherings with Friends and Family: Not on file  . Attends Religious Services: Not on file  . Active Member of Clubs or Organizations: Not on file  . Attends Banker Meetings: Not on file  . Marital Status: Not on file  Intimate Partner Violence:   . Fear of Current or Ex-Partner: Not on file  .  Emotionally Abused: Not on file  . Physically Abused: Not on file  . Sexually Abused: Not on file    No past surgical history on file.  Family History  Problem Relation Age of Onset  . Arthritis Mother   . Diabetes Father   . Heart attack Father 40  . Diabetes Maternal Grandmother   . Arthritis Maternal Grandmother   . Diabetes Maternal Grandfather     No Known Allergies  Current Outpatient Medications on File Prior to Visit  Medication Sig Dispense Refill  . amLODipine (NORVASC) 5 MG tablet TAKE 1 TABLET DAILY FOR BLOOD PRESSURE  90 tablet 2  . atorvastatin (LIPITOR) 40 MG tablet TAKE 1 TABLET DAILY FOR CHOLESTEROL 90 tablet 3  . metFORMIN (GLUCOPHAGE) 500 MG tablet TAKE 1 TABLET TWICE A DAY WITH A MEAL FOR DIABETES 180 tablet 3  . omeprazole (PRILOSEC) 40 MG capsule TAKE 1 CAPSULE DAILY FOR HEARTBURN 90 capsule 1   No current facility-administered medications on file prior to visit.    BP 130/82   Pulse 90   Temp (!) 95.3 F (35.2 C) (Temporal)   Ht 5' 9.25" (1.759 m)   Wt 239 lb 4 oz (108.5 kg)   SpO2 98%   BMI 35.08 kg/m    Objective:   Physical Exam HENT:     Right Ear: Tympanic membrane and ear canal normal.     Left Ear: Tympanic membrane and ear canal normal.  Eyes:     Pupils: Pupils are equal, round, and reactive to light.  Cardiovascular:     Rate and Rhythm: Normal rate and regular rhythm.  Pulmonary:     Effort: Pulmonary effort is normal.     Breath sounds: Normal breath sounds.  Abdominal:     General: Bowel sounds are normal.     Palpations: Abdomen is soft.     Tenderness: There is no abdominal tenderness.  Musculoskeletal:        General: Normal range of motion.     Cervical back: Neck supple.  Skin:    General: Skin is warm and dry.  Neurological:     Mental Status: He is alert and oriented to person, place, and time.     Cranial Nerves: No cranial nerve deficit.     Deep Tendon Reflexes:     Reflex Scores:      Patellar reflexes are 2+ on the right side and 2+ on the left side. Psychiatric:        Mood and Affect: Mood normal.            Assessment & Plan:

## 2020-03-05 NOTE — Patient Instructions (Signed)
Start exercising. You should be getting 150 minutes of moderate intensity exercise weekly.  It is important that you improve your diet. Please limit carbohydrates in the form of white bread, rice, pasta, sweets, fast food, fried food, sugary drinks, etc. Increase your consumption of fresh fruits and vegetables, whole grains, lean protein.  Ensure you are consuming 64 ounces of water daily.  You will be contacted regarding your referral to GI for the colonoscopy.  Please let us know if you have not been contacted within two weeks.   Call to schedule a nurse visit for the shingles vaccine.  Please schedule a follow up appointment in 6 months for diabetes check.   It was a pleasure to see you today!   Preventive Care 32-65 Years Old, Male Preventive care refers to lifestyle choices and visits with your health care provider that can promote health and wellness. This includes:  A yearly physical exam. This is also called an annual well check.  Regular dental and eye exams.  Immunizations.  Screening for certain conditions.  Healthy lifestyle choices, such as eating a healthy diet, getting regular exercise, not using drugs or products that contain nicotine and tobacco, and limiting alcohol use. What can I expect for my preventive care visit? Physical exam Your health care provider will check:  Height and weight. These may be used to calculate body mass index (BMI), which is a measurement that tells if you are at a healthy weight.  Heart rate and blood pressure.  Your skin for abnormal spots. Counseling Your health care provider may ask you questions about:  Alcohol, tobacco, and drug use.  Emotional well-being.  Home and relationship well-being.  Sexual activity.  Eating habits.  Work and work Statistician. What immunizations do I need?  Influenza (flu) vaccine  This is recommended every year. Tetanus, diphtheria, and pertussis (Tdap) vaccine  You may need a Td  booster every 10 years. Varicella (chickenpox) vaccine  You may need this vaccine if you have not already been vaccinated. Zoster (shingles) vaccine  You may need this after age 5. Measles, mumps, and rubella (MMR) vaccine  You may need at least one dose of MMR if you were born in 1957 or later. You may also need a second dose. Pneumococcal conjugate (PCV13) vaccine  You may need this if you have certain conditions and were not previously vaccinated. Pneumococcal polysaccharide (PPSV23) vaccine  You may need one or two doses if you smoke cigarettes or if you have certain conditions. Meningococcal conjugate (MenACWY) vaccine  You may need this if you have certain conditions. Hepatitis A vaccine  You may need this if you have certain conditions or if you travel or work in places where you may be exposed to hepatitis A. Hepatitis B vaccine  You may need this if you have certain conditions or if you travel or work in places where you may be exposed to hepatitis B. Haemophilus influenzae type b (Hib) vaccine  You may need this if you have certain risk factors. Human papillomavirus (HPV) vaccine  If recommended by your health care provider, you may need three doses over 6 months. You may receive vaccines as individual doses or as more than one vaccine together in one shot (combination vaccines). Talk with your health care provider about the risks and benefits of combination vaccines. What tests do I need? Blood tests  Lipid and cholesterol levels. These may be checked every 5 years, or more frequently if you are over 51 years old.  Hepatitis C test.  Hepatitis B test. Screening  Lung cancer screening. You may have this screening every year starting at age 64 if you have a 30-pack-year history of smoking and currently smoke or have quit within the past 15 years.  Prostate cancer screening. Recommendations will vary depending on your family history and other risks.  Colorectal  cancer screening. All adults should have this screening starting at age 68 and continuing until age 95. Your health care provider may recommend screening at age 82 if you are at increased risk. You will have tests every 1-10 years, depending on your results and the type of screening test.  Diabetes screening. This is done by checking your blood sugar (glucose) after you have not eaten for a while (fasting). You may have this done every 1-3 years.  Sexually transmitted disease (STD) testing. Follow these instructions at home: Eating and drinking  Eat a diet that includes fresh fruits and vegetables, whole grains, lean protein, and low-fat dairy products.  Take vitamin and mineral supplements as recommended by your health care provider.  Do not drink alcohol if your health care provider tells you not to drink.  If you drink alcohol: ? Limit how much you have to 0-2 drinks a day. ? Be aware of how much alcohol is in your drink. In the [place] one drink equals one 12 oz bottle of beer (355 mL), one 5 oz glass of wine (148 mL), or one 1 oz glass of hard liquor (44 mL). Lifestyle  Take daily care of your teeth and gums.  Stay active. Exercise for at least 30 minutes on 5 or more days each week.  Do not use any products that contain nicotine or tobacco, such as cigarettes, e-cigarettes, and chewing tobacco. If you need help quitting, ask your health care provider.  If you are sexually active, practice safe sex. Use a condom or other form of protection to prevent STIs (sexually transmitted infections).  Talk with your health care provider about taking a low-dose aspirin every day starting at age 38. What's next?  Go to your health care provider once a year for a well check visit.  Ask your health care provider how often you should have your eyes and teeth checked.  Stay up to date on all vaccines. This information is not intended to replace advice given to you by your health care provider.  Make sure you discuss any questions you have with your health care provider. Document Revised: 06/28/2018 Document Reviewed: 06/28/2018 Elsevier Patient Education  2020 [employer].

## 2020-03-05 NOTE — Assessment & Plan Note (Signed)
Shingles vaccine due, he declines and would like to check on insurance coverage. PSA UTD. Colonoscopy overdue, he accepts today. Referral placed. Encouraged a healthy diet, regular exercise. Exam today stable. Labs reviewed.

## 2020-03-13 ENCOUNTER — Other Ambulatory Visit: Payer: Self-pay

## 2020-03-13 ENCOUNTER — Telehealth (INDEPENDENT_AMBULATORY_CARE_PROVIDER_SITE_OTHER): Payer: Self-pay | Admitting: Gastroenterology

## 2020-03-13 DIAGNOSIS — Z1211 Encounter for screening for malignant neoplasm of colon: Secondary | ICD-10-CM

## 2020-03-13 DIAGNOSIS — Z800 Family history of malignant neoplasm of digestive organs: Secondary | ICD-10-CM

## 2020-03-13 MED ORDER — NA SULFATE-K SULFATE-MG SULF 17.5-3.13-1.6 GM/177ML PO SOLN: 1.0000 | Freq: Once | ORAL | 0 refills | Status: AC

## 2020-03-13 NOTE — Progress Notes (Signed)
Gastroenterology Pre-Procedure Review  Request Date: Friday 04/10/20 Requesting Physician: Dr. Allen Norris  PATIENT REVIEW QUESTIONS: The patient responded to the following health history questions as indicated:    1. Are you having any GI issues? no 2. Do you have a personal history of Polyps? no 3. Do you have a family history of Colon Cancer or Polyps? yes (mother colon cancer) 4. Diabetes Mellitus? yes (type 2 ) 5. Joint replacements in the past 12 months?no 6. Major health problems in the past 3 months?no 7. Any artificial heart valves, MVP, or defibrillator?no    MEDICATIONS & ALLERGIES:    Patient reports the following regarding taking any anticoagulation/antiplatelet therapy:   Plavix, Coumadin, Eliquis, Xarelto, Lovenox, Pradaxa, Brilinta, or Effient? no Aspirin? no  Patient confirms/reports the following medications:  Current Outpatient Medications  Medication Sig Dispense Refill  . amLODipine (NORVASC) 5 MG tablet TAKE 1 TABLET DAILY FOR BLOOD PRESSURE 90 tablet 2  . atorvastatin (LIPITOR) 40 MG tablet TAKE 1 TABLET DAILY FOR CHOLESTEROL 90 tablet 3  . metFORMIN (GLUCOPHAGE) 500 MG tablet TAKE 1 TABLET TWICE A DAY WITH A MEAL FOR DIABETES 180 tablet 3  . omeprazole (PRILOSEC) 40 MG capsule TAKE 1 CAPSULE DAILY FOR HEARTBURN 90 capsule 1  . Na Sulfate-K Sulfate-Mg Sulf 17.5-3.13-1.6 GM/177ML SOLN Take 1 kit by mouth once for 1 dose. 354 mL 0   No current facility-administered medications for this visit.    Patient confirms/reports the following allergies:  No Known Allergies  No orders of the defined types were placed in this encounter.   AUTHORIZATION INFORMATION Primary Insurance: 1D#: Group #:  Secondary Insurance: 1D#: Group #:  SCHEDULE INFORMATION: Date: 04/10/20 Time: Location:[facility]

## 2020-04-08 ENCOUNTER — Other Ambulatory Visit: Payer: BC Managed Care – PPO

## 2020-04-08 ENCOUNTER — Telehealth: Payer: Self-pay | Admitting: Gastroenterology

## 2020-04-08 NOTE — Telephone Encounter (Signed)
Patient called to cancel procedure scheduled on 04/10/20.(Didn't give a reason) Patient stated that he will call back to reschedule. Clinical staff was informed

## 2020-04-10 ENCOUNTER — Encounter: Admission: RE | Payer: Self-pay | Source: Home / Self Care

## 2020-04-10 ENCOUNTER — Ambulatory Visit
Admission: RE | Admit: 2020-04-10 | Payer: BC Managed Care – PPO | Source: Home / Self Care | Admitting: Gastroenterology

## 2020-04-10 SURGERY — COLONOSCOPY WITH PROPOFOL
Anesthesia: General

## 2020-07-22 ENCOUNTER — Other Ambulatory Visit: Payer: Self-pay | Admitting: Primary Care

## 2020-07-22 DIAGNOSIS — K219 Gastro-esophageal reflux disease without esophagitis: Secondary | ICD-10-CM

## 2020-07-22 NOTE — Telephone Encounter (Signed)
Last OV 03/05/20 Last fill 01/27/20  #90/1

## 2020-08-31 ENCOUNTER — Other Ambulatory Visit: Payer: Self-pay | Admitting: Primary Care

## 2020-08-31 DIAGNOSIS — I10 Essential (primary) hypertension: Secondary | ICD-10-CM

## 2020-09-07 ENCOUNTER — Ambulatory Visit: Payer: BC Managed Care – PPO | Admitting: Primary Care

## 2020-12-22 DIAGNOSIS — E119 Type 2 diabetes mellitus without complications: Secondary | ICD-10-CM | POA: Diagnosis not present

## 2020-12-22 LAB — HM DIABETES EYE EXAM

## 2020-12-29 ENCOUNTER — Other Ambulatory Visit: Payer: Self-pay

## 2020-12-29 ENCOUNTER — Emergency Department
Admission: EM | Admit: 2020-12-29 | Discharge: 2020-12-29 | Disposition: A | Discharge status: Home / Self Care | Payer: BC Managed Care – PPO | Attending: Emergency Medicine | Admitting: Emergency Medicine

## 2020-12-29 ENCOUNTER — Encounter: Payer: Self-pay | Admitting: Emergency Medicine

## 2020-12-29 DIAGNOSIS — I10 Essential (primary) hypertension: Secondary | ICD-10-CM | POA: Diagnosis not present

## 2020-12-29 DIAGNOSIS — Z7984 Long term (current) use of oral hypoglycemic drugs: Secondary | ICD-10-CM | POA: Diagnosis not present

## 2020-12-29 DIAGNOSIS — E1165 Type 2 diabetes mellitus with hyperglycemia: Secondary | ICD-10-CM | POA: Diagnosis not present

## 2020-12-29 DIAGNOSIS — Z79899 Other long term (current) drug therapy: Secondary | ICD-10-CM | POA: Insufficient documentation

## 2020-12-29 DIAGNOSIS — E860 Dehydration: Secondary | ICD-10-CM | POA: Insufficient documentation

## 2020-12-29 DIAGNOSIS — Z87891 Personal history of nicotine dependence: Secondary | ICD-10-CM | POA: Diagnosis not present

## 2020-12-29 DIAGNOSIS — R631 Polydipsia: Secondary | ICD-10-CM | POA: Diagnosis not present

## 2020-12-29 LAB — CBG MONITORING, ED
Glucose-Capillary: 487 mg/dL — ABNORMAL HIGH (ref 70–99)
Glucose-Capillary: 448 mg/dL — ABNORMAL HIGH (ref 70–99)
Glucose-Capillary: 373 mg/dL — ABNORMAL HIGH (ref 70–99)
Glucose-Capillary: 329 mg/dL — ABNORMAL HIGH (ref 70–99)

## 2020-12-29 LAB — CBC
HCT: 43.2 % (ref 39.0–52.0)
Hemoglobin: 15.1 g/dL (ref 13.0–17.0)
MCH: 31.3 pg (ref 26.0–34.0)
MCHC: 35 g/dL (ref 30.0–36.0)
MCV: 89.6 fL (ref 80.0–100.0)
Platelets: 244 10*3/uL (ref 150–400)
RBC: 4.82 MIL/uL (ref 4.22–5.81)
RDW: 11.8 % (ref 11.5–15.5)
WBC: 8.2 10*3/uL (ref 4.0–10.5)
nRBC: 0 % (ref 0.0–0.2)

## 2020-12-29 LAB — BASIC METABOLIC PANEL
Anion gap: 9 (ref 5–15)
BUN: 24 mg/dL — ABNORMAL HIGH (ref 6–20)
CO2: 24 mmol/L (ref 22–32)
Calcium: 9.2 mg/dL (ref 8.9–10.3)
Chloride: 98 mmol/L (ref 98–111)
Creatinine, Ser: 0.9 mg/dL (ref 0.61–1.24)
GFR, Estimated: >60 mL/min (ref >60)
Glucose, Bld: 469 mg/dL — ABNORMAL HIGH (ref 70–99)
Potassium: 4.2 mmol/L (ref 3.5–5.1)
Sodium: 131 mmol/L — ABNORMAL LOW (ref 135–145)

## 2020-12-29 LAB — URINALYSIS, COMPLETE (UACMP) WITH MICROSCOPIC
Bacteria, UA: NONE SEEN
Bilirubin Urine: NEGATIVE
Glucose, UA: >=500 mg/dL — AB
Hgb urine dipstick: NEGATIVE
Ketones, ur: 5 mg/dL — AB
Leukocytes,Ua: NEGATIVE
Nitrite: NEGATIVE
Protein, ur: NEGATIVE mg/dL
Specific Gravity, Urine: 1.038 — ABNORMAL HIGH (ref 1.005–1.030)
Squamous Epithelial / HPF: NONE SEEN (ref 0–5)
pH: 5 (ref 5.0–8.0)

## 2020-12-29 MED ORDER — SODIUM CHLORIDE 0.9 % IV BOLUS
1000.0000 mL | Freq: Once | INTRAVENOUS | Status: AC
  Administered 2020-12-29: 1000 mL via INTRAVENOUS

## 2020-12-29 MED ORDER — INSULIN ASPART 100 UNIT/ML IJ SOLN
5.0000 [IU] | Freq: Once | INTRAMUSCULAR | Status: AC
  Administered 2020-12-29: 5 [IU] via INTRAVENOUS
  Filled 2020-12-29: qty 1

## 2020-12-29 NOTE — ED Triage Notes (Signed)
Pt reports that he developed tired and thirsty the last few days. He has noticed that his glucose is going higher even though he has been put on Metformin. He has been under a lot of stress also and thinks that is why it may be up

## 2020-12-29 NOTE — Telephone Encounter (Signed)
Wife called back states that they did talk him into going to urgent care who sent to ED he is there getting fluids and evaluation. They are not going out of town. I have moved his follow up to Thursday and they will call if any issues.

## 2020-12-29 NOTE — Telephone Encounter (Signed)
Noted and strongly advised patient not to leave town and to come in tomorrow, he refused to be seen in our office this week despite multiple offers.   Agree to increase metformin as mentioned. Will evaluate him once he returns.  ED precautions were provided.

## 2020-12-29 NOTE — ED Provider Notes (Signed)
[facility] Emergency Department Provider Note   ____________________________________________   Event Date/Time   First MD Initiated Contact with Patient 12/29/20 1528     (approximate)  I have reviewed the triage vital signs and the nursing notes.   HISTORY  Chief Complaint Hyperglycemia    HPI Mumin Denomme is a 50 y.o. male with a history of type 2 diabetes and acid reflux  Patient reports that for roughly 2 months now he is noticed that he has been feeling thirsty losing some weight, fatigued, and has been checking his blood sugars with a glucometer over the last couple weeks and at times will be reading high sometimes in the 3 or 400 range  He contacted his primary care doctor today, and they advised that he needed to come get seen and he has an appointment on Tuesday as well.  Was given take a beach trip but canceled that  Reports he is pretty sure that all of his symptoms are from his high blood sugar.  He has been on the same dose of metformin for about 2 years, and just this morning increase that dose to 1000 mg twice a day as recommended by his primary care doctor  No recent illness no fevers or chills.  He just feels fatigued, frequently thirsty.    Past Medical History:  Diagnosis Date   Chickenpox    Hyperlipidemia     Patient Active Problem List   Diagnosis Date Noted   Cough 09/17/2019   Preventative health care 11/10/2017   Type 2 diabetes mellitus (HCC) 11/10/2017   Gastroesophageal reflux disease 05/24/2016   Essential hypertension 05/24/2016   Hyperlipidemia 08/10/2015    History reviewed. No pertinent surgical history.  Prior to Admission medications   Medication Sig Start Date End Date Taking? Authorizing Provider  amLODipine (NORVASC) 5 MG tablet TAKE 1 TABLET DAILY FOR BLOOD PRESSURE 08/31/20   Doreene Nest, NP  atorvastatin (LIPITOR) 40 MG tablet TAKE 1 TABLET DAILY FOR CHOLESTEROL 02/04/20   Doreene Nest,  NP  metFORMIN (GLUCOPHAGE) 500 MG tablet TAKE 1 TABLET TWICE A DAY WITH A MEAL FOR DIABETES 02/04/20   Doreene Nest, NP  omeprazole (PRILOSEC) 40 MG capsule TAKE 1 CAPSULE DAILY FOR HEARTBURN 07/22/20   Doreene Nest, NP    Allergies Patient has no known allergies.  Family History  Problem Relation Age of Onset   Arthritis Mother    Diabetes Father    Heart attack Father 50   Diabetes Maternal Grandmother    Arthritis Maternal Grandmother    Diabetes Maternal Grandfather     Social History Social History   Tobacco Use   Smoking status: Former    Pack years: 0.00   Smokeless tobacco: Never  Substance Use Topics   Alcohol use: No    Alcohol/week: 0.0 standard drinks    Review of Systems Constitutional: No fever/chills Eyes: No visual changes. ENT: No sore throat.  Frequently thirsty Cardiovascular: Denies chest pain. Respiratory: Denies shortness of breath. Gastrointestinal: No abdominal pain.   Genitourinary: Negative for dysuria.  Urinating frequently Musculoskeletal: Negative for back pain. Skin: Negative for rash. Neurological: Negative for headaches, areas of focal weakness or numbness.    ____________________________________________   PHYSICAL EXAM:  VITAL SIGNS: ED Triage Vitals  Enc Vitals Group     BP 12/29/20 1416 133/64     Pulse Rate 12/29/20 1416 (!) 105     Resp 12/29/20 1416 16     Temp  12/29/20 1416 98.1 F (36.7 C)     Temp Source 12/29/20 1416 Oral     SpO2 --      Weight 12/29/20 1418 215 lb (97.5 kg)     Height 12/29/20 1418 5\' 11"  (1.803 m)     Head Circumference --      Peak Flow --      Pain Score 12/29/20 1418 0     Pain Loc --      Pain Edu? --      Excl. in GC? --     Constitutional: Alert and oriented. Well appearing and in no acute distress. Eyes: Conjunctivae are normal. Head: Atraumatic. Nose: No congestion/rhinnorhea. Mouth/Throat: Mucous membranes are moist. Neck: No stridor.  Cardiovascular: Normal rate,  regular rhythm. Grossly normal heart sounds.  Good peripheral circulation. Respiratory: Normal respiratory effort.  No retractions. Lungs CTAB. Gastrointestinal: Soft and nontender. No distention. Musculoskeletal: No lower extremity tenderness nor edema. Neurologic:  Normal speech and language. No gross focal neurologic deficits are appreciated.  Skin:  Skin is warm, dry and intact. No rash noted. Psychiatric: Mood and affect are normal. Speech and behavior are normal.  ____________________________________________   LABS (all labs ordered are listed, but only abnormal results are displayed)  Labs Reviewed  BASIC METABOLIC PANEL - Abnormal; Notable for the following components:      Result Value   Sodium 131 (*)    Glucose, Bld 469 (*)    BUN 24 (*)    All other components within normal limits  URINALYSIS, COMPLETE (UACMP) WITH MICROSCOPIC - Abnormal; Notable for the following components:   Color, Urine STRAW (*)    APPearance CLEAR (*)    Specific Gravity, Urine 1.038 (*)    Glucose, UA >=500 (*)    Ketones, ur 5 (*)    All other components within normal limits  CBG MONITORING, ED - Abnormal; Notable for the following components:   Glucose-Capillary 487 (*)    All other components within normal limits  CBG MONITORING, ED - Abnormal; Notable for the following components:   Glucose-Capillary 448 (*)    All other components within normal limits  CBG MONITORING, ED - Abnormal; Notable for the following components:   Glucose-Capillary 373 (*)    All other components within normal limits  CBG MONITORING, ED - Abnormal; Notable for the following components:   Glucose-Capillary 329 (*)    All other components within normal limits  CBC  CBG MONITORING, ED  CBG MONITORING, ED  CBG MONITORING, ED  CBG MONITORING, ED  CBG MONITORING, ED  CBG MONITORING, ED  CBG MONITORING, ED  CBG MONITORING, ED  CBG MONITORING, ED  CBG MONITORING, ED  CBG MONITORING, ED  CBG MONITORING, ED   CBG MONITORING, ED  CBG MONITORING, ED  CBG MONITORING, ED   ____________________________________________  EKG   ____________________________________________  RADIOLOGY   ____________________________________________   PROCEDURES  Procedure(s) performed: None  Procedures  Critical Care performed: No  ____________________________________________   INITIAL IMPRESSION / ASSESSMENT AND PLAN / ED COURSE  Pertinent labs & imaging results that were available during my care of the patient were reviewed by me and considered in my medical decision making (see chart for details).   Patient presents with polyuria polydipsia, elevated blood sugars.  He is a type II diabetic, been on stable dose metformin for what he reports about 2 years except had a dose increase today by his PCP.  He is alert nontoxic well-appearing in no acute distress.  His laboratory evaluation is indicative of hyperglycemia without evidence of acute complication or DKA.  We will rehydrate, work to obtain improvement in his hyperglycemia, and anticipate likely be able to be discharged to trial his increased dose of metformin.  Clinical Course as of 12/29/20 1946  Tue Dec 29, 2020  1539 Glucose(!): 469 [MQ]  1539 Anion gap: 9 [MQ]  1539 CO2: 24 Hyperglycemia with normal anion gap and normal CO2.  No evidence of DKA [MQ]  1539 WBC: 8.2 [MQ]  1539 Hemoglobin: 15.1 Normal [MQ]  1539 Bacteria, UA: NONE SEEN [MQ]  1540 WBC, UA: 0-5 [MQ]  1540 Leukocytes,Ua: NEGATIVE [MQ]  1540 Nitrite: NEGATIVE No evidence infection.  Only mild ketones [MQ]    Clinical Course User Index [MQ] Sharyn Creamer, MD   He has no infectious symptoms, no obvious precipitating factors aside from the fact that he has been on stable dose of metformin and slowly developed symptoms of polyuria polydipsia over the last 2 months now with hyperglycemia as well.  ----------------------------------------- 5:14 PM on  12/29/2020 ----------------------------------------- Patient has received 1 L of fluid bolus, blood sugar however remains elevated in the mid 400s.  Will give additional liter bolus as well as 5 units of aspart.  Patient understanding agreeable with plan.  Resting comfortably at this time without distress.  Vitals:   12/29/20 1416 12/29/20 1805  BP: 133/64 127/73  Pulse: (!) 105 86  Resp: 16 16  Temp: 98.1 F (36.7 C) (!) 97.5 F (36.4 C)  SpO2:  94%     ----------------------------------------- 7:47 PM on 12/29/2020 ----------------------------------------- Patient resting comfortably at this time.  Blood glucose is improved, awake alert no distress.  He and his wife both at the bedside, and advised that he now has an appointment on Thursday of this week with Jerelyn Charles for follow-up on his hyperglycemia.  He will be advancing his metformin to 1000 mg twice a day, close follow-up plan Thursday.  In the interim he will check his blood sugars at least twice daily and record them, and notify his doctor if he is having any sugar readings above 300 in the interim.  If worsening or concerning symptoms arise, will return to the ER for further evaluation.  Return precautions and treatment recommendations and follow-up discussed with the patient who is agreeable with the plan.  ____________________________________________   FINAL CLINICAL IMPRESSION(S) / ED DIAGNOSES  Final diagnoses:  Dehydration  Type 2 diabetes mellitus with hyperglycemia, without long-term current use of insulin (HCC)        Note:  This document was prepared using Dragon voice recognition software and may include unintentional dictation errors       Sharyn Creamer, MD 12/29/20 1948

## 2020-12-29 NOTE — Telephone Encounter (Signed)
Glad to hear! ED notes will be reviewed.  Will evaluate patient as scheduled.

## 2020-12-29 NOTE — Telephone Encounter (Signed)
Left message to return call to our office.  Informed we had additional questions and need to get him seen. Call as soon as he can

## 2020-12-29 NOTE — Discharge Instructions (Addendum)
Please follow-up closely with Vernona Rieger, NP as planned.

## 2020-12-29 NOTE — Telephone Encounter (Signed)
Patient returned call. States has had elevated CGB between 350 and 400. Has had 3-4 readings in last two weeks that were just H. Has had weight loss as well as extreme fatigue. Patient is going out of town in the morning and will not be back until the weekend. Have reviewed with Jae Dire. Informed per her recommendation that he not go out of town until we can see him in office. Patient declined not able to put of trip. Per Jae Dire I have given instructions to take two tablet of metformin twice a day. Made an appointment in our office of Tuesday of next week. He will push water. Have reviewed red words and if any he will go to Ed or walk in while out of town. He will keep log of readings and bring with him to appointment.

## 2020-12-31 ENCOUNTER — Ambulatory Visit (INDEPENDENT_AMBULATORY_CARE_PROVIDER_SITE_OTHER): Payer: BC Managed Care – PPO | Admitting: Primary Care

## 2020-12-31 ENCOUNTER — Other Ambulatory Visit: Payer: Self-pay

## 2020-12-31 ENCOUNTER — Encounter: Payer: Self-pay | Admitting: Primary Care

## 2020-12-31 VITALS — BP 142/86 | HR 100 | Temp 98.1°F | Ht 71.0 in | Wt 213.0 lb

## 2020-12-31 DIAGNOSIS — E119 Type 2 diabetes mellitus without complications: Secondary | ICD-10-CM | POA: Diagnosis not present

## 2020-12-31 LAB — POCT GLYCOSYLATED HEMOGLOBIN (HGB A1C): Hemoglobin A1C: 13.2 % — AB (ref 4.0–5.6)

## 2020-12-31 MED ORDER — LEVEMIR FLEXTOUCH 100 UNIT/ML ~~LOC~~ SOPN: 15.0000 [IU] | PEN_INJECTOR | Freq: Every day | SUBCUTANEOUS | 0 refills | Status: DC

## 2020-12-31 MED ORDER — PEN NEEDLES 31G X 6 MM MISC
0 refills | Status: DC
Start: 2020-12-31 — End: 2021-02-04

## 2020-12-31 MED ORDER — METFORMIN HCL 1000 MG PO TABS: 1000.0000 mg | ORAL_TABLET | Freq: Two times a day (BID) | ORAL | 3 refills | Status: DC

## 2020-12-31 NOTE — Patient Instructions (Signed)
I changed your metformin medication to 1000 mg and sent this to mail order.   Start Levemir insulin. Inject 15 units into the skin once daily for diabetes.  Start checking your blood sugar levels.  Appropriate times to check your blood sugar levels are:  -Before any meal (breakfast, lunch, dinner) -Two hours after any meal (breakfast, lunch, dinner) -Bedtime  Record your readings and send them to me via My Chart in 2 weeks.  We will see you in August as scheduled.  It was a pleasure to see you today!

## 2020-12-31 NOTE — Progress Notes (Signed)
Subjective:    Patient ID: Austin Benson, male    DOB: 1970/08/04, 50 y.o.   MRN: [id number]  HPI  Austin Benson is a very pleasant 50 y.o. male with a history of hypertension, GERD, type 2 diabetes, hyperlipidemia who presents today for follow up.  He's not been seen since August 2021. He sent a My Chart message earlier this week with reports of polydipsia, polyuria, and hyperglycemia with glucose readings ranging 400's to "high". He was advised to come in ASAP for evaluation.   He did present to [facility] ED on 12/29/20, received IV fluids, glucose readings and lab work without evidence for DKA. He was discharged home later that day.  Today he endorses a 3-4 month history of polyuria and polydipsia, fatigue. He denies blurred vision. Over the last year he's been under a lot of stress, family stress and work stress. He endorses a poor diet over the last year, has improved over the last few months.   He has been compliant to his Metformin 500 mg BID, has since been taking 1000 mg BID since directed to do so, has not had GI upset with a dose increase. Over the last two days his glucose levels have been in the 300's.   Wt Readings from Last 3 Encounters:  12/31/20 213 lb (96.6 kg)  12/29/20 215 lb (97.5 kg)  03/05/20 239 lb 4 oz (108.5 kg)     BP Readings from Last 3 Encounters:  12/31/20 (!) 142/86  12/29/20 125/75  03/05/20 130/82        Review of Systems  Eyes:  Negative for visual disturbance.  Respiratory:  Negative for shortness of breath.   Cardiovascular:  Negative for chest pain.  Endocrine: Positive for polydipsia and polyuria. Negative for polyphagia.  Neurological:  Negative for numbness.        Past Medical History:  Diagnosis Date   Chickenpox    Hyperlipidemia     Social History   Socioeconomic History   Marital status: Married    Spouse name: Not on file   Number of children: Not on file   Years of education: Not on file   Highest education level: Not  on file  Occupational History   Not on file  Tobacco Use   Smoking status: Former    Pack years: 0.00   Smokeless tobacco: Never  Substance and Sexual Activity   Alcohol use: No    Alcohol/week: 0.0 standard drinks   Drug use: Not on file   Sexual activity: Not on file  Other Topics Concern   Not on file  Social History Narrative   Married.   2 children, 3 grandchildren.   Work's as a Chartered certified accountant.    Enjoys hunting, fishing.    Social Determinants of Health   Financial Resource Strain: Not on file  Food Insecurity: Not on file  Transportation Needs: Not on file  Physical Activity: Not on file  Stress: Not on file  Social Connections: Not on file  Intimate Partner Violence: Not on file    No past surgical history on file.  Family History  Problem Relation Age of Onset   Arthritis Mother    Diabetes Father    Heart attack Father 52   Diabetes Maternal Grandmother    Arthritis Maternal Grandmother    Diabetes Maternal Grandfather     No Known Allergies  Current Outpatient Medications on File Prior to Visit  Medication Sig Dispense Refill   amLODipine (NORVASC) 5 MG tablet  TAKE 1 TABLET DAILY FOR BLOOD PRESSURE 90 tablet 1   atorvastatin (LIPITOR) 40 MG tablet TAKE 1 TABLET DAILY FOR CHOLESTEROL 90 tablet 3   metFORMIN (GLUCOPHAGE) 500 MG tablet TAKE 1 TABLET TWICE A DAY WITH A MEAL FOR DIABETES (Patient taking differently: Take 500 mg by mouth in the morning and at bedtime. Take two tablets twice a day with a meal for diabetes) 180 tablet 3   omeprazole (PRILOSEC) 40 MG capsule TAKE 1 CAPSULE DAILY FOR HEARTBURN 90 capsule 3   No current facility-administered medications on file prior to visit.    BP (!) 142/86   Pulse 100   Temp 98.1 F (36.7 C) (Temporal)   Ht 5\' 11"  (1.803 m)   Wt 213 lb (96.6 kg)   SpO2 97%   BMI 29.71 kg/m  Objective:   Physical Exam Cardiovascular:     Rate and Rhythm: Normal rate and regular rhythm.  Pulmonary:     Effort:  Pulmonary effort is normal.     Breath sounds: Normal breath sounds. No wheezing or rales.  Musculoskeletal:     Cervical back: Neck supple.  Skin:    General: Skin is warm and dry.  Neurological:     Mental Status: He is alert and oriented to person, place, and time.          Assessment & Plan:      This visit occurred during the SARS-CoV-2 public health emergency.  Safety protocols were in place, including screening questions prior to the visit, additional usage of staff PPE, and extensive cleaning of exam room while observing appropriate contact time as indicated for disinfecting solutions.

## 2020-12-31 NOTE — Assessment & Plan Note (Signed)
Uncontrolled with A1c of 13.2 today.  Long discussion about the need to gain better control over diabetes, and to follow-up regularly as recommended.  Continue metformin 1000 mg twice daily, however this will not be enough to get him to goal.  Will initiate Levemir 15 units daily, hopefully temporary until glucose levels remain stable.  I provided him with glucose log sheets and instructed him to check at least twice daily, rotating times of checks.  I also advised him to send me glucose readings in 2 weeks via MyChart.  We will see him back in about 6 to 8 weeks for follow-up.

## 2021-01-05 ENCOUNTER — Ambulatory Visit: Payer: BC Managed Care – PPO | Admitting: Primary Care

## 2021-01-14 DIAGNOSIS — E119 Type 2 diabetes mellitus without complications: Secondary | ICD-10-CM

## 2021-01-29 ENCOUNTER — Other Ambulatory Visit: Payer: Self-pay | Admitting: Primary Care

## 2021-01-29 DIAGNOSIS — E782 Mixed hyperlipidemia: Secondary | ICD-10-CM

## 2021-02-01 ENCOUNTER — Encounter: Payer: Self-pay | Admitting: Primary Care

## 2021-02-04 MED ORDER — GLIPIZIDE ER 10 MG PO TB24: 10.0000 mg | ORAL_TABLET | Freq: Every day | ORAL | 1 refills | Status: DC

## 2021-02-04 MED ORDER — PEN NEEDLES 31G X 6 MM MISC: 0 refills | Status: DC

## 2021-02-04 MED ORDER — LEVEMIR FLEXTOUCH 100 UNIT/ML ~~LOC~~ SOPN: 25.0000 [IU] | PEN_INJECTOR | Freq: Every day | SUBCUTANEOUS | 0 refills | Status: DC

## 2021-02-08 DIAGNOSIS — D485 Neoplasm of uncertain behavior of skin: Secondary | ICD-10-CM | POA: Diagnosis not present

## 2021-02-08 DIAGNOSIS — L820 Inflamed seborrheic keratosis: Secondary | ICD-10-CM | POA: Diagnosis not present

## 2021-02-08 DIAGNOSIS — D225 Melanocytic nevi of trunk: Secondary | ICD-10-CM | POA: Diagnosis not present

## 2021-02-08 DIAGNOSIS — L918 Other hypertrophic disorders of the skin: Secondary | ICD-10-CM | POA: Diagnosis not present

## 2021-02-08 DIAGNOSIS — L905 Scar conditions and fibrosis of skin: Secondary | ICD-10-CM | POA: Diagnosis not present

## 2021-02-26 ENCOUNTER — Other Ambulatory Visit: Payer: Self-pay | Admitting: Primary Care

## 2021-02-26 DIAGNOSIS — I10 Essential (primary) hypertension: Secondary | ICD-10-CM

## 2021-02-26 NOTE — Telephone Encounter (Signed)
Patient up to date on visits. Has follow up next month will call in 30 days no refill.

## 2021-03-01 DIAGNOSIS — L905 Scar conditions and fibrosis of skin: Secondary | ICD-10-CM | POA: Diagnosis not present

## 2021-03-01 DIAGNOSIS — D485 Neoplasm of uncertain behavior of skin: Secondary | ICD-10-CM | POA: Diagnosis not present

## 2021-03-18 ENCOUNTER — Other Ambulatory Visit: Payer: Self-pay

## 2021-03-18 ENCOUNTER — Ambulatory Visit (INDEPENDENT_AMBULATORY_CARE_PROVIDER_SITE_OTHER): Payer: BC Managed Care – PPO | Admitting: Primary Care

## 2021-03-18 ENCOUNTER — Encounter: Payer: Self-pay | Admitting: Primary Care

## 2021-03-18 VITALS — BP 140/80 | HR 76 | Temp 98.0°F | Ht 71.0 in | Wt 212.0 lb

## 2021-03-18 DIAGNOSIS — Z125 Encounter for screening for malignant neoplasm of prostate: Secondary | ICD-10-CM

## 2021-03-18 DIAGNOSIS — I10 Essential (primary) hypertension: Secondary | ICD-10-CM

## 2021-03-18 DIAGNOSIS — E119 Type 2 diabetes mellitus without complications: Secondary | ICD-10-CM

## 2021-03-18 DIAGNOSIS — Z23 Encounter for immunization: Secondary | ICD-10-CM | POA: Diagnosis not present

## 2021-03-18 DIAGNOSIS — Z0000 Encounter for general adult medical examination without abnormal findings: Secondary | ICD-10-CM

## 2021-03-18 DIAGNOSIS — E782 Mixed hyperlipidemia: Secondary | ICD-10-CM | POA: Diagnosis not present

## 2021-03-18 DIAGNOSIS — K219 Gastro-esophageal reflux disease without esophagitis: Secondary | ICD-10-CM

## 2021-03-18 LAB — HEMOGLOBIN A1C: Hgb A1c MFr Bld: 8.6 % — ABNORMAL HIGH (ref 4.6–6.5)

## 2021-03-18 LAB — LIPID PANEL
Cholesterol: 119 mg/dL (ref 0–200)
HDL: 40.4 mg/dL (ref >39.00)
NonHDL: 78.26
Total CHOL/HDL Ratio: 3
Triglycerides: 207 mg/dL — ABNORMAL HIGH (ref 0.0–149.0)
VLDL: 41.4 mg/dL — ABNORMAL HIGH (ref 0.0–40.0)

## 2021-03-18 LAB — COMPREHENSIVE METABOLIC PANEL
ALT: 21 U/L (ref 0–53)
AST: 18 U/L (ref 0–37)
Albumin: 4.7 g/dL (ref 3.5–5.2)
Alkaline Phosphatase: 95 U/L (ref 39–117)
BUN: 16 mg/dL (ref 6–23)
CO2: 29 mEq/L (ref 19–32)
Calcium: 9.8 mg/dL (ref 8.4–10.5)
Chloride: 102 mEq/L (ref 96–112)
Creatinine, Ser: 0.92 mg/dL (ref 0.40–1.50)
GFR: 95.84 mL/min (ref >60.00)
Glucose, Bld: 124 mg/dL — ABNORMAL HIGH (ref 70–99)
Potassium: 4.6 mEq/L (ref 3.5–5.1)
Sodium: 140 mEq/L (ref 135–145)
Total Bilirubin: 1.4 mg/dL — ABNORMAL HIGH (ref 0.2–1.2)
Total Protein: 7.3 g/dL (ref 6.0–8.3)

## 2021-03-18 LAB — MICROALBUMIN / CREATININE URINE RATIO
Creatinine,U: 35 mg/dL
Microalb Creat Ratio: 2 mg/g (ref 0.0–30.0)
Microalb, Ur: <0.7 mg/dL (ref 0.0–1.9)

## 2021-03-18 LAB — LDL CHOLESTEROL, DIRECT: Direct LDL: 54 mg/dL

## 2021-03-18 LAB — PSA: PSA: 0.85 ng/mL (ref 0.10–4.00)

## 2021-03-18 MED ORDER — LOSARTAN POTASSIUM 50 MG PO TABS: 50.0000 mg | ORAL_TABLET | Freq: Every day | ORAL | 0 refills | Status: DC

## 2021-03-18 NOTE — Assessment & Plan Note (Signed)
Repeat A1C pending today.  He would like to come off Levemir. Will first check A1C.  Consider resuming Glipizide XL 10 mg, reducing Levemir to 12 units, continue Metformin 1000 mg BID and have him update glucose readings via My Chart in 2 weeks. He agrees.  Foot exam UTD. Eye exam scheduled. Managed on statin. Adding ARB today. Check urine microalbumin as he's not been on ACE or ARB for last one year.  Follow up in 3-6 months depending on A1C.

## 2021-03-18 NOTE — Patient Instructions (Addendum)
Stop taking amlodipine 5 mg for blood pressure.  Start taking losartan 50 mg for blood pressure.   Stop by the lab prior to leaving today. I will notify you of your results once received.   Please schedule a follow up visit to meet back with me in 2-3 weeks for blood pressure check.   It was a pleasure to see you today!  Preventive Care 11-52 Years Old, Male Preventive care refers to lifestyle choices and visits with your health care provider that can promote health and wellness. This includes: A yearly physical exam. This is also called an annual wellness visit. Regular dental and eye exams. Immunizations. Screening for certain conditions. Healthy lifestyle choices, such as: Eating a healthy diet. Getting regular exercise. Not using drugs or products that contain nicotine and tobacco. Limiting alcohol use. What can I expect for my preventive care visit? Physical exam Your health care provider will check your: Height and weight. These may be used to calculate your BMI (body mass index). BMI is a measurement that tells if you are at a healthy weight. Heart rate and blood pressure. Body temperature. Skin for abnormal spots. Counseling Your health care provider may ask you questions about your: Past medical problems. Family's medical history. Alcohol, tobacco, and drug use. Emotional well-being. Home life and relationship well-being. Sexual activity. Diet, exercise, and sleep habits. Work and work Astronomer. Access to firearms. What immunizations do I need? Vaccines are usually given at various ages, according to a schedule. Your health care provider will recommend vaccines for you based on your age, medical history, and lifestyle or other factors, such as travel or where you work. What tests do I need? Blood tests Lipid and cholesterol levels. These may be checked every 5 years, or more often if you are over 65 years old. Hepatitis C test. Hepatitis B  test. Screening Lung cancer screening. You may have this screening every year starting at age 68 if you have a 30-pack-year history of smoking and currently smoke or have quit within the past 15 years. Prostate cancer screening. Recommendations will vary depending on your family history and other risks. Genital exam to check for testicular cancer or hernias. Colorectal cancer screening. All adults should have this screening starting at age 73 and continuing until age 43. Your health care provider may recommend screening at age 77 if you are at increased risk. You will have tests every 1-10 years, depending on your results and the type of screening test. Diabetes screening. This is done by checking your blood sugar (glucose) after you have not eaten for a while (fasting). You may have this done every 1-3 years. STD (sexually transmitted disease) testing, if you are at risk. Follow these instructions at home: Eating and drinking  Eat a diet that includes fresh fruits and vegetables, whole grains, lean protein, and low-fat dairy products. Take vitamin and mineral supplements as recommended by your health care provider. Do not drink alcohol if your health care provider tells you not to drink. If you drink alcohol: Limit how much you have to 0-2 drinks a day. Be aware of how much alcohol is in your drink. In the [place] one drink equals one 12 oz bottle of beer (355 mL), one 5 oz glass of wine (148 mL), or one 1 oz glass of hard liquor (44 mL). Lifestyle Take daily care of your teeth and gums. Brush your teeth every morning and night with fluoride toothpaste. Floss one time each day. Stay active. Exercise for  at least 30 minutes 5 or more days each week. Do not use any products that contain nicotine or tobacco, such as cigarettes, e-cigarettes, and chewing tobacco. If you need help quitting, ask your health care provider. Do not use drugs. If you are sexually active, practice safe sex. Use a  condom or other form of protection to prevent STIs (sexually transmitted infections). If told by your health care provider, take low-dose aspirin daily starting at age 55. Find healthy ways to cope with stress, such as: Meditation, yoga, or listening to music. Journaling. Talking to a trusted person. Spending time with friends and family. Safety Always wear your seat belt while driving or riding in a vehicle. Do not drive: If you have been drinking alcohol. Do not ride with someone who has been drinking. When you are tired or distracted. While texting. Wear a helmet and other protective equipment during sports activities. If you have firearms in your house, make sure you follow all gun safety procedures. What's next? Go to your health care provider once a year for an annual wellness visit. Ask your health care provider how often you should have your eyes and teeth checked. Stay up to date on all vaccines. This information is not intended to replace advice given to you by your health care provider. Make sure you discuss any questions you have with your health care provider. Document Revised: 09/11/2020 Document Reviewed: 06/28/2018 Elsevier Patient Education  2022 Elsevier Inc.        Recombinant Zoster (Shingles) Vaccine: What You Need to Know 1. Why get vaccinated? Recombinant zoster (shingles) vaccine can prevent shingles. Shingles (also called herpes zoster, or just zoster) is a painful skin rash, usually with blisters. In addition to the rash, shingles can cause fever, headache, chills, or upset stomach. Rarely, shingles can lead to complications such as pneumonia, hearing problems, blindness, brain inflammation (encephalitis), or death. The risk of shingles increases with age. The most common complication of shingles is long-term nerve pain called postherpetic neuralgia (PHN). PHN occurs in the areas where the shingles rash was and can last for months or years after the rash  goes away. The pain from PHN can be severe and debilitating. The risk of PHN increases with age. An older adult with shingles is more likely to develop PHN and have longer lasting and more severe pain than a younger person. People with weakened immune systems also have a higher risk of getting shingles and complications from the disease. Shingles is caused by varicella-zoster virus, the same virus that causes chickenpox. After you have chickenpox, the virus stays in your body and can cause shingles later in life. Shingles cannot be passed from one person to another, but the virus that causes shingles can spread and cause chickenpox in someone who has never had chickenpox or has never received chickenpox vaccine. 2. Recombinant shingles vaccine Recombinant shingles vaccine provides strong protection against shingles. By preventing shingles, recombinant shingles vaccine also protects against PHN and other complications. Recombinant shingles vaccine is recommended for: Adults 50 years and older Adults 19 years and older who have a weakened immune system because of disease or treatments Shingles vaccine is given as a two-dose series. For most people, the second dose should be given 2 to 6 months after the first dose. Some people who have or will have a weakened immune system can get the second dose 1 to 2 months after the first dose. Ask your health care provider for guidance. People who have had shingles  in the past and people who have received varicella (chickenpox) vaccine are recommended to get recombinant shingles vaccine. The vaccine is also recommended for people who have already gotten another type of shingles vaccine, the live shingles vaccine. There is no live virus in recombinant shingles vaccine. Shingles vaccine may be given at the same time as other vaccines. 3. Talk with your health care provider Tell your vaccination provider if the person getting the vaccine: Has had an allergic reaction  after a previous dose of recombinant shingles vaccine, or has any severe, life-threatening allergies Is currently experiencing an episode of shingles Is pregnant In some cases, your health care provider may decide to postpone shingles vaccination until a future visit. People with minor illnesses, such as a cold, may be vaccinated. People who are moderately or severely ill should usually wait until they recover before getting recombinant shingles vaccine. Your health care provider can give you more information. 4. Risks of a vaccine reaction A sore arm with mild or moderate pain is very common after recombinant shingles vaccine. Redness and swelling can also happen at the site of the injection. Tiredness, muscle pain, headache, shivering, fever, stomach pain, and nausea are common after recombinant shingles vaccine. These side effects may temporarily prevent a vaccinated person from doing regular activities. Symptoms usually go away on their own in 2 to 3 days. You should still get the second dose of recombinant shingles vaccine even if you had one of these reactions after the first dose. Guillain-Barr syndrome (GBS), a serious nervous system disorder, has been reported very rarely after recombinant zoster vaccine. People sometimes faint after medical procedures, including vaccination. Tell your provider if you feel dizzy or have vision changes or ringing in the ears. As with any medicine, there is a very remote chance of a vaccine causing a severe allergic reaction, other serious injury, or death. 5. What if there is a serious problem? An allergic reaction could occur after the vaccinated person leaves the clinic. If you see signs of a severe allergic reaction (hives, swelling of the face and throat, difficulty breathing, a fast heartbeat, dizziness, or weakness), call 9-1-1 and get the person to the nearest hospital. For other signs that concern you, call your health care provider. Adverse  reactions should be reported to the Vaccine Adverse Event Reporting System (VAERS). Your health care provider will usually file this report, or you can do it yourself. Visit the VAERS website at www.vaers.LAgents.no or call 651-173-5763. VAERS is only for reporting reactions, and VAERS staff members do not give medical advice. 6. How can I learn more? Ask your health care provider. Call your local or state health department. Visit the website of the Food and Drug Administration (FDA) for vaccine package inserts and additional information at GoldCloset.com.ee. Contact the Centers for Disease Control and Prevention (CDC): Call 850-078-3166 (1-800-CDC-INFO) or Visit CDC's website at PicCapture.uy. Vaccine Information Statement Recombinant Zoster Vaccine (08/21/2020) This information is not intended to replace advice given to you by your health care provider. Make sure you discuss any questions you have with your health care provider. Document Revised: 09/04/2020 Document Reviewed: 09/04/2020 Elsevier Patient Education  2022 [employer].

## 2021-03-18 NOTE — Assessment & Plan Note (Signed)
First shingrix vaccine due and provided today. Other vaccines UTD. Declines colonoscopy despite recommendations. PSA due and pending.  Discussed the importance of a healthy diet and regular exercise in order for weight loss, and to reduce the risk of further co-morbidity.  Exam today stable. Labs pending.

## 2021-03-18 NOTE — Assessment & Plan Note (Signed)
Compliant to atorvastatin 40 mg, continue same. °Repeat lipid panel pending.  °

## 2021-03-18 NOTE — Assessment & Plan Note (Signed)
Doing well on omeprazole 40 mg, continue same.  

## 2021-03-18 NOTE — Assessment & Plan Note (Signed)
Above goal today, also with prior reading. He does not check BP at home. Sounds like he could be experiencing side effects from amlodipine.   Stop amlodipine. Start losartan 50 mg.  We will see him back in 2-3 weeks for BP check and BMP.

## 2021-03-18 NOTE — Progress Notes (Signed)
Subjective:    Patient ID: Austin Benson, male    DOB: [birth year], 50 y.o.   MRN: [id number]  HPI  Demetrice Combes is a very pleasant 50 y.o. male who presents today for complete physical and follow up of chronic conditions.  He does not check his BP at home often. He has noticed feeling intermittent lightheadedness when standing up after bending forward. This began about 5-6 months ago.   Glucose readings are ranging 100-150 throughout the day. He is no longer taking Glipizide XL 10 mg as of 1 month ago due to dips in glucose to 60's. He is compliant to his metformin 1000 mg BID, Levimir 25 units once daily. He would like to come off insulin.   Immunizations: -Tetanus: 2019 -Influenza: Declines  -Covid-19: 2 vaccines -Shingles: Due -Pneumonia: 2020  Diet: Fair diet, he has improved his diet overall.  Exercise: No regular exercise, active at work.  Eye exam: Completes annually  Dental exam: Completes semi-annually   Colonoscopy: Never completed, declines PSA: Due  BP Readings from Last 3 Encounters:  03/18/21 140/80  12/31/20 (!) 142/86  12/29/20 125/75        Review of Systems  Constitutional:  Negative for unexpected weight change.  HENT:  Negative for rhinorrhea.   Respiratory:  Negative for shortness of breath.   Cardiovascular:  Negative for chest pain.  Gastrointestinal:  Negative for constipation and diarrhea.  Genitourinary:  Negative for difficulty urinating.  Musculoskeletal:  Negative for arthralgias and myalgias.  Skin:  Negative for rash.  Allergic/Immunologic: Negative for environmental allergies.  Neurological:  Positive for light-headedness. Negative for dizziness and headaches.  Psychiatric/Behavioral:  The patient is not nervous/anxious.         Past Medical History:  Diagnosis Date   Chickenpox    Hyperlipidemia     Social History   Socioeconomic History   Marital status: Married    Spouse name: Not on file   Number of children: Not on  file   Years of education: Not on file   Highest education level: Not on file  Occupational History   Not on file  Tobacco Use   Smoking status: Former   Smokeless tobacco: Never  Substance and Sexual Activity   Alcohol use: No    Alcohol/week: 0.0 standard drinks   Drug use: Not on file   Sexual activity: Not on file  Other Topics Concern   Not on file  Social History Narrative   Married.   2 children, 3 grandchildren.   Work's as a Chartered certified accountant.    Enjoys hunting, fishing.    Social Determinants of Health   Financial Resource Strain: Not on file  Food Insecurity: Not on file  Transportation Needs: Not on file  Physical Activity: Not on file  Stress: Not on file  Social Connections: Not on file  Intimate Partner Violence: Not on file    History reviewed. No pertinent surgical history.  Family History  Problem Relation Age of Onset   Arthritis Mother    Diabetes Father    Heart attack Father 31   Diabetes Maternal Grandmother    Arthritis Maternal Grandmother    Diabetes Maternal Grandfather     No Known Allergies  Current Outpatient Medications on File Prior to Visit  Medication Sig Dispense Refill   amLODipine (NORVASC) 5 MG tablet TAKE 1 TABLET DAILY FOR BLOOD PRESSURE 90 tablet 0   atorvastatin (LIPITOR) 40 MG tablet TAKE 1 TABLET DAILY FOR CHOLESTEROL 90 tablet 0  insulin detemir (LEVEMIR FLEXTOUCH) 100 UNIT/ML FlexPen Inject 25 Units into the skin daily. For diabetes 15 mL 0   Insulin Pen Needle (PEN NEEDLES) 31G X 6 MM MISC Use nightly with insulin. 100 each 0   metFORMIN (GLUCOPHAGE) 1000 MG tablet Take 1 tablet (1,000 mg total) by mouth 2 (two) times daily with a meal. For diabetes. 180 tablet 3   omeprazole (PRILOSEC) 40 MG capsule TAKE 1 CAPSULE DAILY FOR HEARTBURN 90 capsule 3   glipiZIDE (GLUCOTROL XL) 10 MG 24 hr tablet Take 1 tablet (10 mg total) by mouth daily with breakfast. For diabetes. (Patient not taking: Reported on 03/18/2021) 90 tablet 1    No current facility-administered medications on file prior to visit.    BP 140/80   Pulse 76   Temp 98 F (36.7 C) (Temporal)   Ht 5\' 11"  (1.803 m)   Wt 212 lb (96.2 kg)   SpO2 98%   BMI 29.57 kg/m  Objective:   Physical Exam HENT:     Right Ear: Tympanic membrane and ear canal normal.     Left Ear: Tympanic membrane and ear canal normal.     Nose: Nose normal.     Right Sinus: No maxillary sinus tenderness or frontal sinus tenderness.     Left Sinus: No maxillary sinus tenderness or frontal sinus tenderness.  Eyes:     Conjunctiva/sclera: Conjunctivae normal.  Neck:     Thyroid: No thyromegaly.     Vascular: No carotid bruit.  Cardiovascular:     Rate and Rhythm: Normal rate and regular rhythm.     Heart sounds: Normal heart sounds.  Pulmonary:     Effort: Pulmonary effort is normal.     Breath sounds: Normal breath sounds. No wheezing or rales.  Abdominal:     General: Bowel sounds are normal.     Palpations: Abdomen is soft.     Tenderness: There is no abdominal tenderness.  Musculoskeletal:        General: Normal range of motion.     Cervical back: Neck supple.  Skin:    General: Skin is warm and dry.  Neurological:     Mental Status: He is alert and oriented to person, place, and time.     Cranial Nerves: No cranial nerve deficit.     Deep Tendon Reflexes: Reflexes are normal and symmetric.  Psychiatric:        Mood and Affect: Mood normal.          Assessment & Plan:      This visit occurred during the SARS-CoV-2 public health emergency.  Safety protocols were in place, including screening questions prior to the visit, additional usage of staff PPE, and extensive cleaning of exam room while observing appropriate contact time as indicated for disinfecting solutions.

## 2021-03-18 NOTE — Addendum Note (Signed)
Addended by: Donnamarie Poag on: 03/18/2021 04:07 PM   Modules accepted: Orders

## 2021-04-07 ENCOUNTER — Ambulatory Visit (INDEPENDENT_AMBULATORY_CARE_PROVIDER_SITE_OTHER): Payer: BC Managed Care – PPO | Admitting: Primary Care

## 2021-04-07 ENCOUNTER — Encounter: Payer: Self-pay | Admitting: Primary Care

## 2021-04-07 ENCOUNTER — Other Ambulatory Visit: Payer: Self-pay | Admitting: Primary Care

## 2021-04-07 ENCOUNTER — Other Ambulatory Visit: Payer: Self-pay

## 2021-04-07 VITALS — BP 126/78 | HR 82 | Temp 98.2°F | Ht 71.0 in | Wt 211.0 lb

## 2021-04-07 DIAGNOSIS — I10 Essential (primary) hypertension: Secondary | ICD-10-CM | POA: Diagnosis not present

## 2021-04-07 DIAGNOSIS — Z23 Encounter for immunization: Secondary | ICD-10-CM

## 2021-04-07 LAB — BASIC METABOLIC PANEL
BUN: 26 mg/dL — ABNORMAL HIGH (ref 6–23)
CO2: 28 mEq/L (ref 19–32)
Calcium: 10.1 mg/dL (ref 8.4–10.5)
Chloride: 102 mEq/L (ref 96–112)
Creatinine, Ser: 0.95 mg/dL (ref 0.40–1.50)
GFR: 92.18 mL/min (ref >60.00)
Glucose, Bld: 131 mg/dL — ABNORMAL HIGH (ref 70–99)
Potassium: 4.6 mEq/L (ref 3.5–5.1)
Sodium: 137 mEq/L (ref 135–145)

## 2021-04-07 MED ORDER — LOSARTAN POTASSIUM 50 MG PO TABS: 50.0000 mg | ORAL_TABLET | Freq: Every day | ORAL | 3 refills | Status: DC

## 2021-04-07 NOTE — Assessment & Plan Note (Signed)
Improved and controlled on losartan 50 mg.   BMP pending. Continue losartan 50 mg daily.

## 2021-04-07 NOTE — Progress Notes (Signed)
Subjective:    Patient ID: Austin Benson, male    DOB: 1971/05/07, 50 y.o.   MRN: [id number]  HPI  Austin Benson is a very pleasant 50 y.o. male with a history of type 2 diabetes, hypertension, hyperlipidemia who presents today for follow up of hypertension.   He was last evaluated a few weeks ago for his CPE, BP was above goal despite amlodipine 5 mg and he was also experiencing lower extremity postural dizziness. We decided to stop amlodipine 5 mg and start losartan 50 mg.   Since his last visit his dizziness has resolved. He denies chest pain and headaches.   BP Readings from Last 3 Encounters:  04/07/21 126/78  03/18/21 140/80  12/31/20 (!) 142/86        Review of Systems  Respiratory:  Negative for shortness of breath.   Cardiovascular:  Negative for chest pain.  Neurological:  Negative for dizziness.        Past Medical History:  Diagnosis Date   Chickenpox    Hyperlipidemia     Social History   Socioeconomic History   Marital status: Married    Spouse name: Not on file   Number of children: Not on file   Years of education: Not on file   Highest education level: Not on file  Occupational History   Not on file  Tobacco Use   Smoking status: Former   Smokeless tobacco: Never  Substance and Sexual Activity   Alcohol use: No    Alcohol/week: 0.0 standard drinks   Drug use: Not on file   Sexual activity: Not on file  Other Topics Concern   Not on file  Social History Narrative   Married.   2 children, 3 grandchildren.   Work's as a Chartered certified accountant.    Enjoys hunting, fishing.    Social Determinants of Health   Financial Resource Strain: Not on file  Food Insecurity: Not on file  Transportation Needs: Not on file  Physical Activity: Not on file  Stress: Not on file  Social Connections: Not on file  Intimate Partner Violence: Not on file    No past surgical history on file.  Family History  Problem Relation Age of Onset   Arthritis Mother     Diabetes Father    Heart attack Father 37   Diabetes Maternal Grandmother    Arthritis Maternal Grandmother    Diabetes Maternal Grandfather     No Known Allergies  Current Outpatient Medications on File Prior to Visit  Medication Sig Dispense Refill   atorvastatin (LIPITOR) 40 MG tablet TAKE 1 TABLET DAILY FOR CHOLESTEROL 90 tablet 0   glipiZIDE (GLUCOTROL XL) 10 MG 24 hr tablet Take 1 tablet (10 mg total) by mouth daily with breakfast. For diabetes. (Patient not taking: Reported on 03/18/2021) 90 tablet 1   insulin detemir (LEVEMIR FLEXTOUCH) 100 UNIT/ML FlexPen Inject 25 Units into the skin daily. For diabetes 15 mL 0   Insulin Pen Needle (PEN NEEDLES) 31G X 6 MM MISC Use nightly with insulin. 100 each 0   losartan (COZAAR) 50 MG tablet Take 1 tablet (50 mg total) by mouth daily. For blood pressure. 30 tablet 0   metFORMIN (GLUCOPHAGE) 1000 MG tablet Take 1 tablet (1,000 mg total) by mouth 2 (two) times daily with a meal. For diabetes. 180 tablet 3   omeprazole (PRILOSEC) 40 MG capsule TAKE 1 CAPSULE DAILY FOR HEARTBURN 90 capsule 3   No current facility-administered medications on file prior to visit.  BP 126/78   Pulse 82   Temp 98.2 F (36.8 C) (Temporal)   Ht 5\' 11"  (1.803 m)   Wt 211 lb (95.7 kg)   SpO2 98%   BMI 29.43 kg/m  Objective:   Physical Exam Cardiovascular:     Rate and Rhythm: Normal rate and regular rhythm.  Pulmonary:     Effort: Pulmonary effort is normal.     Breath sounds: Normal breath sounds. No wheezing or rales.  Musculoskeletal:     Cervical back: Neck supple.  Skin:    General: Skin is warm and dry.  Neurological:     Mental Status: He is alert and oriented to person, place, and time.          Assessment & Plan:      This visit occurred during the SARS-CoV-2 public health emergency.  Safety protocols were in place, including screening questions prior to the visit, additional usage of staff PPE, and extensive cleaning of exam room  while observing appropriate contact time as indicated for disinfecting solutions.

## 2021-04-07 NOTE — Patient Instructions (Addendum)
Stop by the lab prior to leaving today. I will notify you of your results once received.   Continue taking losartan 50 mg.   It was a pleasure to see you today!        Influenza (Flu) Vaccine (Inactivated or Recombinant): What You Need to Know 1. Why get vaccinated? Influenza vaccine can prevent influenza (flu). Flu is a contagious disease that spreads around the [place] every year, usually between October and May. Anyone can get the flu, but it is more dangerous for some people. Infants and young children, people 69 years and older, pregnant people, and people with certain health conditions or a weakened immune system are at greatest risk of flu complications. Pneumonia, bronchitis, sinus infections, and ear infections are examples of flu-related complications. If you have a medical condition, such as heart disease, cancer, or diabetes, flu can make it worse. Flu can cause fever and chills, sore throat, muscle aches, fatigue, cough, headache, and runny or stuffy nose. Some people may have vomiting and diarrhea, though this is more common in children than adults. In an average year, thousands of people in the [place] die from flu, and many more are hospitalized. Flu vaccine prevents millions of illnesses and flu-related visits to the doctor each year. 2. Influenza vaccines CDC recommends everyone 6 months and older get vaccinated every flu season. Children 6 months through 25 years of age may need 2 doses during a single flu season. Everyone else needs only 1 dose each flu season. It takes about 2 weeks for protection to develop after vaccination. There are many flu viruses, and they are always changing. Each year a new flu vaccine is made to protect against the influenza viruses believed to be likely to cause disease in the upcoming flu season. Even when the vaccine doesn't exactly match these viruses, it may still provide some protection. Influenza vaccine does not cause  flu. Influenza vaccine may be given at the same time as other vaccines. 3. Talk with your health care provider Tell your vaccination provider if the person getting the vaccine: Has had an allergic reaction after a previous dose of influenza vaccine, or has any severe, life-threatening allergies Has ever had Guillain-Barr Syndrome (also called "GBS") In some cases, your health care provider may decide to postpone influenza vaccination until a future visit. Influenza vaccine can be administered at any time during pregnancy. People who are or will be pregnant during influenza season should receive inactivated influenza vaccine. People with minor illnesses, such as a cold, may be vaccinated. People who are moderately or severely ill should usually wait until they recover before getting influenza vaccine. Your health care provider can give you more information. 4. Risks of a vaccine reaction Soreness, redness, and swelling where the shot is given, fever, muscle aches, and headache can happen after influenza vaccination. There may be a very small increased risk of Guillain-Barr Syndrome (GBS) after inactivated influenza vaccine (the flu shot). Young children who get the flu shot along with pneumococcal vaccine (PCV13) and/or DTaP vaccine at the same time might be slightly more likely to have a seizure caused by fever. Tell your health care provider if a child who is getting flu vaccine has ever had a seizure. People sometimes faint after medical procedures, including vaccination. Tell your provider if you feel dizzy or have vision changes or ringing in the ears. As with any medicine, there is a very remote chance of a vaccine causing a severe allergic reaction, other serious  injury, or death. 5. What if there is a serious problem? An allergic reaction could occur after the vaccinated person leaves the clinic. If you see signs of a severe allergic reaction (hives, swelling of the face and throat,  difficulty breathing, a fast heartbeat, dizziness, or weakness), call 9-1-1 and get the person to the nearest hospital. For other signs that concern you, call your health care provider. Adverse reactions should be reported to the Vaccine Adverse Event Reporting System (VAERS). Your health care provider will usually file this report, or you can do it yourself. Visit the VAERS website at www.vaers.LAgents.no or call 602-663-0647. VAERS is only for reporting reactions, and VAERS staff members do not give medical advice. 6. The National Vaccine Injury Compensation Program The [employer] Vaccine Injury Compensation Program (VICP) is a federal program that was created to compensate people who may have been injured by certain vaccines. Claims regarding alleged injury or death due to vaccination have a time limit for filing, which may be as short as two years. Visit the VICP website at SpiritualWord.at or call 913-468-0285 to learn about the program and about filing a claim. 7. How can I learn more? Ask your health care provider. Call your local or state health department. Visit the website of the Food and Drug Administration (FDA) for vaccine package inserts and additional information at FinderList.no. Contact the Centers for Disease Control and Prevention (CDC): Call (574)208-6643 (1-800-CDC-INFO) or Visit CDC's website at BiotechRoom.com.cy. Vaccine Information Statement Inactivated Influenza Vaccine (02/21/2020) This information is not intended to replace advice given to you by your health care provider. Make sure you discuss any questions you have with your health care provider. Document Revised: 04/09/2020 Document Reviewed: 04/09/2020 Elsevier Patient Education  2022 [employer].

## 2021-04-14 ENCOUNTER — Other Ambulatory Visit: Payer: Self-pay | Admitting: Primary Care

## 2021-04-14 DIAGNOSIS — I10 Essential (primary) hypertension: Secondary | ICD-10-CM

## 2021-04-29 ENCOUNTER — Other Ambulatory Visit: Payer: Self-pay | Admitting: Primary Care

## 2021-04-29 DIAGNOSIS — E782 Mixed hyperlipidemia: Secondary | ICD-10-CM

## 2021-05-11 ENCOUNTER — Other Ambulatory Visit: Payer: Self-pay | Admitting: Primary Care

## 2021-05-11 DIAGNOSIS — I10 Essential (primary) hypertension: Secondary | ICD-10-CM

## 2021-07-19 ENCOUNTER — Telehealth: Payer: Self-pay | Admitting: Primary Care

## 2021-07-19 DIAGNOSIS — E119 Type 2 diabetes mellitus without complications: Secondary | ICD-10-CM

## 2021-07-19 DIAGNOSIS — K219 Gastro-esophageal reflux disease without esophagitis: Secondary | ICD-10-CM

## 2021-07-19 NOTE — Telephone Encounter (Signed)
I would like to see him soon for a diabetes follow up. Can he come in some time this month?

## 2021-07-28 NOTE — Telephone Encounter (Signed)
Left message to return call to our office.  

## 2021-07-29 NOTE — Telephone Encounter (Signed)
Pt called back he said that he could do Feb, he has been scheduled for 2/10

## 2021-08-10 NOTE — Telephone Encounter (Signed)
Do you want me to get in sooner? They are probably going to need to move appointment due to move.

## 2021-08-10 NOTE — Telephone Encounter (Signed)
Yes, see if he can come in sooner so that we can address his foot issue and diabetes.

## 2021-08-27 ENCOUNTER — Encounter: Payer: Self-pay | Admitting: Primary Care

## 2021-08-27 ENCOUNTER — Ambulatory Visit (INDEPENDENT_AMBULATORY_CARE_PROVIDER_SITE_OTHER): Payer: BC Managed Care – PPO | Admitting: Primary Care

## 2021-08-27 ENCOUNTER — Other Ambulatory Visit: Payer: Self-pay

## 2021-08-27 VITALS — BP 126/82 | HR 97 | Temp 98.6°F | Ht 71.0 in | Wt 233.0 lb

## 2021-08-27 DIAGNOSIS — M79671 Pain in right foot: Secondary | ICD-10-CM | POA: Diagnosis not present

## 2021-08-27 DIAGNOSIS — M79672 Pain in left foot: Secondary | ICD-10-CM | POA: Diagnosis not present

## 2021-08-27 DIAGNOSIS — E119 Type 2 diabetes mellitus without complications: Secondary | ICD-10-CM

## 2021-08-27 LAB — POCT GLYCOSYLATED HEMOGLOBIN (HGB A1C): Hemoglobin A1C: 6.5 % — AB (ref 4.0–5.6)

## 2021-08-27 MED ORDER — GABAPENTIN 100 MG PO CAPS
ORAL_CAPSULE | ORAL | 0 refills | Status: DC
Start: 2021-08-27 — End: 2021-09-12

## 2021-08-27 NOTE — Assessment & Plan Note (Signed)
Improved with A1C today of 6.5!  Continue metformin 1000 mg BID, Glipizide XL 10 mg daily.  Foot and eye exams UTD. Managed on ARB and statin. Pneumonia vaccine UTD.  Follow up in 6 months.

## 2021-08-27 NOTE — Progress Notes (Deleted)
° °  Subjective:    Patient ID: Austin Benson, male    DOB: 12-02-1970, 51 y.o.   MRN: [id number]  HPI  Austin Benson is a very pleasant 51 y.o. male who presents today      Review of Systems       Past Medical History:  Diagnosis Date   Chickenpox    Hyperlipidemia     Social History   Socioeconomic History   Marital status: Married    Spouse name: Not on file   Number of children: Not on file   Years of education: Not on file   Highest education level: Not on file  Occupational History   Not on file  Tobacco Use   Smoking status: Former   Smokeless tobacco: Never  Substance and Sexual Activity   Alcohol use: No    Alcohol/week: 0.0 standard drinks   Drug use: Not on file   Sexual activity: Not on file  Other Topics Concern   Not on file  Social History Narrative   Married.   2 children, 3 grandchildren.   Work's as a Chartered certified accountant.    Enjoys hunting, fishing.    Social Determinants of Health   Financial Resource Strain: Not on file  Food Insecurity: Not on file  Transportation Needs: Not on file  Physical Activity: Not on file  Stress: Not on file  Social Connections: Not on file  Intimate Partner Violence: Not on file    History reviewed. No pertinent surgical history.  Family History  Problem Relation Age of Onset   Arthritis Mother    Diabetes Father    Heart attack Father 53   Diabetes Maternal Grandmother    Arthritis Maternal Grandmother    Diabetes Maternal Grandfather     No Known Allergies  Current Outpatient Medications on File Prior to Visit  Medication Sig Dispense Refill   atorvastatin (LIPITOR) 40 MG tablet TAKE 1 TABLET DAILY FOR CHOLESTEROL 90 tablet 3   glipiZIDE (GLUCOTROL XL) 10 MG 24 hr tablet TAKE 1 TABLET DAILY WITH BREAKFAST FOR DIABETES 90 tablet 0   losartan (COZAAR) 50 MG tablet TAKE 1 TABLET (50 MG TOTAL) BY MOUTH DAILY. FOR BLOOD PRESSURE. 90 tablet 3   metFORMIN (GLUCOPHAGE) 1000 MG tablet Take 1 tablet (1,000 mg  total) by mouth 2 (two) times daily with a meal. For diabetes. 180 tablet 3   omeprazole (PRILOSEC) 40 MG capsule TAKE 1 CAPSULE DAILY FOR HEARTBURN 90 capsule 2   No current facility-administered medications on file prior to visit.    BP 126/82    Pulse 97    Temp 98.6 F (37 C) (Temporal)    Ht 5\' 11"  (1.803 m)    Wt 233 lb (105.7 kg)    SpO2 97%    BMI 32.50 kg/m  Objective:   Physical Exam        Assessment & Plan:      This visit occurred during the SARS-CoV-2 public health emergency.  Safety protocols were in place, including screening questions prior to the visit, additional usage of staff PPE, and extensive cleaning of exam room while observing appropriate contact time as indicated for disinfecting solutions.

## 2021-08-27 NOTE — Assessment & Plan Note (Signed)
Suspect neuropathy from diabetes, could also be plantar fasciitis given his line of work.  Fortunately his diabetes has improved drastically!  Foot exam today negative.  Trial of gabapentin 100 mg sent to pharmacy. He can take 100 to 300 mg up to TID. Drowsiness precautions provided.   Consider podiatry evaluation if no improvement. He will update via MyChart in 2 weeks.

## 2021-08-27 NOTE — Patient Instructions (Signed)
Continue your diabetes medications as prescribed.  Start gabapentin 100 mg for your foot pain. You can take 1 to 3 capsules by mouth twice daily. This may cause drowsiness so start by taking at bedtime.  Please update me in 2 weeks via MyChart.  We will see you in 6 months!  It was a pleasure to see you today!

## 2021-08-27 NOTE — Progress Notes (Signed)
Subjective:    Patient ID: Austin Benson, male    DOB: Mar 11, 1971, 51 y.o.   MRN: [id number]  HPI  Saw Austin Benson is a very pleasant 51 y.o. male with a history of type 2 diabetes, hypertension, GERD, hyperlipidemia who presents today for follow up of diabetes.   Current medications include: Glipizide XL 10 mg daily, metformin 1000 mg BID  He is checking his blood glucose twice weekly and is getting readings of 80's-120's.   Last A1C: 8.6 in September 2022,  Last Eye Exam: UTD Last Foot Exam: UTD  Pneumonia Vaccination: 2020 Urine Microalbumin: None. Managed on ARB Statin: atorvastatin   Dietary changes since last visit: None.    Exercise: Active at work.   He would also like to mention chronic foot pain (stabbing and raw) and paresthesias which are present to bilateral plantar feet from the toes, the balls of the feet, and lateral feet. He's tried different insoles and shoes without improvement.   His symptoms are constant, worse when he puts on socks in the morning and also when walking at work. He works on his feet 6 hours at a time at work. Works for [employer]. Symptoms began in November 2022. He's been taking Aleve 440 mg BID for the last few months with temporary improvement.   He has a history of plantar fasciitis, these symptoms feel different.    BP Readings from Last 3 Encounters:  08/27/21 126/82  04/07/21 126/78  03/18/21 140/80      Review of Systems  Eyes:  Negative for visual disturbance.  Respiratory:  Negative for shortness of breath.   Cardiovascular:  Negative for chest pain.  Endocrine: Negative for polyuria.  Neurological:  Positive for numbness. Negative for dizziness.        Past Medical History:  Diagnosis Date   Chickenpox    Hyperlipidemia     Social History   Socioeconomic History   Marital status: Married    Spouse name: Not on file   Number of children: Not on file   Years of education: Not on file   Highest education level: Not on  file  Occupational History   Not on file  Tobacco Use   Smoking status: Former   Smokeless tobacco: Never  Substance and Sexual Activity   Alcohol use: No    Alcohol/week: 0.0 standard drinks   Drug use: Not on file   Sexual activity: Not on file  Other Topics Concern   Not on file  Social History Narrative   Married.   2 children, 3 grandchildren.   Work's as a Furniture conservator/restorer.    Enjoys hunting, fishing.    Social Determinants of Health   Financial Resource Strain: Not on file  Food Insecurity: Not on file  Transportation Needs: Not on file  Physical Activity: Not on file  Stress: Not on file  Social Connections: Not on file  Intimate Partner Violence: Not on file    History reviewed. No pertinent surgical history.  Family History  Problem Relation Age of Onset   Arthritis Mother    Diabetes Father    Heart attack Father 13   Diabetes Maternal Grandmother    Arthritis Maternal Grandmother    Diabetes Maternal Grandfather     No Known Allergies  Current Outpatient Medications on File Prior to Visit  Medication Sig Dispense Refill   atorvastatin (LIPITOR) 40 MG tablet TAKE 1 TABLET DAILY FOR CHOLESTEROL 90 tablet 3   glipiZIDE (GLUCOTROL XL) 10 MG 24 hr  tablet TAKE 1 TABLET DAILY WITH BREAKFAST FOR DIABETES 90 tablet 0   losartan (COZAAR) 50 MG tablet TAKE 1 TABLET (50 MG TOTAL) BY MOUTH DAILY. FOR BLOOD PRESSURE. 90 tablet 3   metFORMIN (GLUCOPHAGE) 1000 MG tablet Take 1 tablet (1,000 mg total) by mouth 2 (two) times daily with a meal. For diabetes. 180 tablet 3   omeprazole (PRILOSEC) 40 MG capsule TAKE 1 CAPSULE DAILY FOR HEARTBURN 90 capsule 2   No current facility-administered medications on file prior to visit.    BP 126/82    Pulse 97    Temp 98.6 F (37 C) (Temporal)    Ht 5\' 11"  (1.803 m)    Wt 233 lb (105.7 kg)    SpO2 97%    BMI 32.50 kg/m  Objective:   Physical Exam Cardiovascular:     Rate and Rhythm: Normal rate and regular rhythm.     Pulses:           Dorsalis pedis pulses are 2+ on the right side and 2+ on the left side.       Posterior tibial pulses are 2+ on the right side and 2+ on the left side.  Pulmonary:     Effort: Pulmonary effort is normal.     Breath sounds: Normal breath sounds. No wheezing or rales.  Musculoskeletal:     Cervical back: Neck supple.       Feet:  Feet:     Right foot:     Skin integrity: No skin breakdown, erythema, warmth, callus or dry skin.     Left foot:     Skin integrity: No skin breakdown, erythema, warmth, callus or dry skin.  Skin:    General: Skin is warm and dry.  Neurological:     Mental Status: He is alert and oriented to person, place, and time.          Assessment & Plan:  20 minutes spent face to face with patient, >50% spent counseling or coordinating care.     This visit occurred during the SARS-CoV-2 public health emergency.  Safety protocols were in place, including screening questions prior to the visit, additional usage of staff PPE, and extensive cleaning of exam room while observing appropriate contact time as indicated for disinfecting solutions.

## 2021-09-10 ENCOUNTER — Other Ambulatory Visit: Payer: Self-pay

## 2021-09-10 DIAGNOSIS — I10 Essential (primary) hypertension: Secondary | ICD-10-CM

## 2021-09-10 NOTE — Telephone Encounter (Signed)
Refill in September was called into local pharmacy. Received faxed request from mail order for new prescription

## 2021-09-11 DIAGNOSIS — M79671 Pain in right foot: Secondary | ICD-10-CM

## 2021-09-11 DIAGNOSIS — M79672 Pain in left foot: Secondary | ICD-10-CM

## 2021-09-12 MED ORDER — LOSARTAN POTASSIUM 50 MG PO TABS: 50.0000 mg | ORAL_TABLET | Freq: Every day | ORAL | 1 refills | Status: DC

## 2021-09-12 MED ORDER — GABAPENTIN 100 MG PO CAPS: 100.0000 mg | ORAL_CAPSULE | Freq: Three times a day (TID) | ORAL | 1 refills | Status: DC

## 2021-10-15 ENCOUNTER — Other Ambulatory Visit: Payer: Self-pay | Admitting: Primary Care

## 2021-10-15 DIAGNOSIS — E119 Type 2 diabetes mellitus without complications: Secondary | ICD-10-CM

## 2021-12-08 ENCOUNTER — Other Ambulatory Visit: Payer: Self-pay | Admitting: Primary Care

## 2021-12-08 DIAGNOSIS — E119 Type 2 diabetes mellitus without complications: Secondary | ICD-10-CM

## 2022-02-21 ENCOUNTER — Other Ambulatory Visit: Payer: Self-pay | Admitting: Primary Care

## 2022-02-21 DIAGNOSIS — I10 Essential (primary) hypertension: Secondary | ICD-10-CM

## 2022-03-23 ENCOUNTER — Encounter: Payer: BC Managed Care – PPO | Admitting: Primary Care

## 2022-03-25 ENCOUNTER — Encounter: Payer: BC Managed Care – PPO | Admitting: Primary Care

## 2022-04-08 ENCOUNTER — Encounter: Payer: Self-pay | Admitting: Primary Care

## 2022-04-08 ENCOUNTER — Ambulatory Visit (INDEPENDENT_AMBULATORY_CARE_PROVIDER_SITE_OTHER): Payer: BC Managed Care – PPO | Admitting: Primary Care

## 2022-04-08 VITALS — BP 138/82 | HR 80 | Temp 97.5°F | Ht 71.0 in | Wt 232.0 lb

## 2022-04-08 DIAGNOSIS — K219 Gastro-esophageal reflux disease without esophagitis: Secondary | ICD-10-CM

## 2022-04-08 DIAGNOSIS — E1165 Type 2 diabetes mellitus with hyperglycemia: Secondary | ICD-10-CM

## 2022-04-08 DIAGNOSIS — Z125 Encounter for screening for malignant neoplasm of prostate: Secondary | ICD-10-CM

## 2022-04-08 DIAGNOSIS — Z1211 Encounter for screening for malignant neoplasm of colon: Secondary | ICD-10-CM

## 2022-04-08 DIAGNOSIS — E782 Mixed hyperlipidemia: Secondary | ICD-10-CM | POA: Diagnosis not present

## 2022-04-08 DIAGNOSIS — Z23 Encounter for immunization: Secondary | ICD-10-CM | POA: Diagnosis not present

## 2022-04-08 DIAGNOSIS — Z0000 Encounter for general adult medical examination without abnormal findings: Secondary | ICD-10-CM | POA: Diagnosis not present

## 2022-04-08 DIAGNOSIS — I10 Essential (primary) hypertension: Secondary | ICD-10-CM | POA: Diagnosis not present

## 2022-04-08 LAB — POCT GLYCOSYLATED HEMOGLOBIN (HGB A1C): Hemoglobin A1C: 8 % — AB (ref 4.0–5.6)

## 2022-04-08 MED ORDER — METFORMIN HCL ER 500 MG PO TB24: 1000.0000 mg | ORAL_TABLET | Freq: Every day | ORAL | 1 refills | Status: DC

## 2022-04-08 MED ORDER — OZEMPIC (0.25 OR 0.5 MG/DOSE) 2 MG/3ML ~~LOC~~ SOPN: PEN_INJECTOR | SUBCUTANEOUS | 0 refills | Status: DC

## 2022-04-08 NOTE — Patient Instructions (Signed)
We changed your metformin dose to the XR version. Take 2 tablets by mouth once daily.  Start semaglutide (Ozempic) for diabetes. Inject 0.25 mg into the skin once weekly x 4 weeks, then increase to 0.5 mg weekly thereafter.  Complete the Cologuard Kit once received.  Please schedule a follow up visit for 3 months for diabetes check.  It was a pleasure to see you today!  Preventive Care 53-53 Years Old, Male Preventive care refers to lifestyle choices and visits with your health care provider that can promote health and wellness. Preventive care visits are also called wellness exams. What can I expect for my preventive care visit? Counseling During your preventive care visit, your health care provider may ask about your: Medical history, including: Past medical problems. Family medical history. Current health, including: Emotional well-being. Home life and relationship well-being. Sexual activity. Lifestyle, including: Alcohol, nicotine or tobacco, and drug use. Access to firearms. Diet, exercise, and sleep habits. Safety issues such as seatbelt and bike helmet use. Sunscreen use. Work and work Statistician. Physical exam Your health care provider will check your: Height and weight. These may be used to calculate your BMI (body mass index). BMI is a measurement that tells if you are at a healthy weight. Waist circumference. This measures the distance around your waistline. This measurement also tells if you are at a healthy weight and may help predict your risk of certain diseases, such as type 2 diabetes and high blood pressure. Heart rate and blood pressure. Body temperature. Skin for abnormal spots. What immunizations do I need?  Vaccines are usually given at various ages, according to a schedule. Your health care provider will recommend vaccines for you based on your age, medical history, and lifestyle or other factors, such as travel or where you work. What tests do I  need? Screening Your health care provider may recommend screening tests for certain conditions. This may include: Lipid and cholesterol levels. Diabetes screening. This is done by checking your blood sugar (glucose) after you have not eaten for a while (fasting). Hepatitis B test. Hepatitis C test. HIV (human immunodeficiency virus) test. STI (sexually transmitted infection) testing, if you are at risk. Lung cancer screening. Prostate cancer screening. Colorectal cancer screening. Talk with your health care provider about your test results, treatment options, and if necessary, the need for more tests. Follow these instructions at home: Eating and drinking  Eat a diet that includes fresh fruits and vegetables, whole grains, lean protein, and low-fat dairy products. Take vitamin and mineral supplements as recommended by your health care provider. Do not drink alcohol if your health care provider tells you not to drink. If you drink alcohol: Limit how much you have to 0-2 drinks a day. Know how much alcohol is in your drink. In the [place] one drink equals one 12 oz bottle of beer (355 mL), one 5 oz glass of wine (148 mL), or one 1 oz glass of hard liquor (44 mL). Lifestyle Brush your teeth every morning and night with fluoride toothpaste. Floss one time each day. Exercise for at least 30 minutes 5 or more days each week. Do not use any products that contain nicotine or tobacco. These products include cigarettes, chewing tobacco, and vaping devices, such as e-cigarettes. If you need help quitting, ask your health care provider. Do not use drugs. If you are sexually active, practice safe sex. Use a condom or other form of protection to prevent STIs. Take aspirin only as told by your health  care provider. Make sure that you understand how much to take and what form to take. Work with your health care provider to find out whether it is safe and beneficial for you to take aspirin daily. Find  healthy ways to manage stress, such as: Meditation, yoga, or listening to music. Journaling. Talking to a trusted person. Spending time with friends and family. Minimize exposure to UV radiation to reduce your risk of skin cancer. Safety Always wear your seat belt while driving or riding in a vehicle. Do not drive: If you have been drinking alcohol. Do not ride with someone who has been drinking. When you are tired or distracted. While texting. If you have been using any mind-altering substances or drugs. Wear a helmet and other protective equipment during sports activities. If you have firearms in your house, make sure you follow all gun safety procedures. What's next? Go to your health care provider once a year for an annual wellness visit. Ask your health care provider how often you should have your eyes and teeth checked. Stay up to date on all vaccines. This information is not intended to replace advice given to you by your health care provider. Make sure you discuss any questions you have with your health care provider. Document Revised: 12/30/2020 Document Reviewed: 12/30/2020 Elsevier Patient Education  [employer].

## 2022-04-08 NOTE — Assessment & Plan Note (Addendum)
Uncontrolled with A1C today of 8.0.   He admits to some weight gain, also missing several evening doses of metformin.  Continue glipizide XL 10 mg daily. Change metformin to XR 1000 mg once daily due to difficulty with compliance.  Start Ozempic 0.25 mg weekly x 4 weeks, then increase to 0.5 mg weekly thereafter.  Follow up in 3 months.

## 2022-04-08 NOTE — Assessment & Plan Note (Signed)
Controlled.  Continue omeprazole 40 mg daily. 

## 2022-04-08 NOTE — Assessment & Plan Note (Signed)
Immunizations UTD. Influenza vaccine provided today. PSA due and pending. Colonoscopy overdue, he agrees to Cologuard. Will send to his home.  Discussed the importance of a healthy diet and regular exercise in order for weight loss, and to reduce the risk of further co-morbidity.  Exam stable. Labs pending.  Follow up in 1 year for repeat physical.

## 2022-04-08 NOTE — Progress Notes (Signed)
Subjective:    Patient ID: Austin Benson, male    DOB: [birth year], 51 y.o.   MRN: [id number]  HPI  Lido Maske is a very pleasant 51 y.o. male who presents today for complete physical and follow up of chronic conditions.  Immunizations: -Tetanus: 2019 -Influenza: Due today -Covid-19: 2 vaccines  -Shingles: Completed Shingrix -Pneumonia: 2020  Diet: Fair diet.  Exercise: No regular exercise.  Eye exam: Completes annually  Dental exam: Completes semi-annually   Colonoscopy: Never completed, declines today. He agrees to [employer].  PSA: Due  BP Readings from Last 3 Encounters:  04/08/22 138/82  08/27/21 126/82  04/07/21 126/78     Wt Readings from Last 3 Encounters:  04/08/22 232 lb (105.2 kg)  08/27/21 233 lb (105.7 kg)  04/07/21 211 lb (95.7 kg)        Review of Systems  Constitutional:  Negative for unexpected weight change.  HENT:  Negative for rhinorrhea.   Respiratory:  Negative for cough and shortness of breath.   Cardiovascular:  Negative for chest pain.  Gastrointestinal:  Negative for constipation and diarrhea.  Genitourinary:  Negative for difficulty urinating.  Musculoskeletal:  Negative for arthralgias and myalgias.  Skin:  Negative for rash.  Allergic/Immunologic: Negative for environmental allergies.  Neurological:  Negative for dizziness, numbness and headaches.  Psychiatric/Behavioral:  The patient is not nervous/anxious.          Past Medical History:  Diagnosis Date   Chickenpox    Hyperlipidemia     Social History   Socioeconomic History   Marital status: Married    Spouse name: Not on file   Number of children: Not on file   Years of education: Not on file   Highest education level: Not on file  Occupational History   Not on file  Tobacco Use   Smoking status: Former   Smokeless tobacco: Never  Substance and Sexual Activity   Alcohol use: No    Alcohol/week: 0.0 standard drinks of alcohol   Drug use: Not on file    Sexual activity: Not on file  Other Topics Concern   Not on file  Social History Narrative   Married.   2 children, 3 grandchildren.   Work's as a Furniture conservator/restorer.    Enjoys hunting, fishing.    Social Determinants of Health   Financial Resource Strain: Not on file  Food Insecurity: Not on file  Transportation Needs: Not on file  Physical Activity: Not on file  Stress: Not on file  Social Connections: Not on file  Intimate Partner Violence: Not on file    History reviewed. No pertinent surgical history.  Family History  Problem Relation Age of Onset   Arthritis Mother    Diabetes Father    Heart attack Father 40   Diabetes Maternal Grandmother    Arthritis Maternal Grandmother    Diabetes Maternal Grandfather     No Known Allergies  Current Outpatient Medications on File Prior to Visit  Medication Sig Dispense Refill   atorvastatin (LIPITOR) 40 MG tablet TAKE 1 TABLET DAILY FOR CHOLESTEROL 90 tablet 3   glipiZIDE (GLUCOTROL XL) 10 MG 24 hr tablet TAKE 1 TABLET DAILY WITH BREAKFAST FOR DIABETES 90 tablet 1   losartan (COZAAR) 50 MG tablet Take 1 tablet (50 mg total) by mouth daily. for blood pressure. Office visit required for further refills. 90 tablet 0   omeprazole (PRILOSEC) 40 MG capsule TAKE 1 CAPSULE DAILY FOR HEARTBURN 90 capsule 2   No current facility-administered  medications on file prior to visit.    BP 138/82   Pulse 80   Temp (!) 97.5 F (36.4 C) (Temporal)   Ht 5\' 11"  (1.803 m)   Wt 232 lb (105.2 kg)   SpO2 94%   BMI 32.36 kg/m  Objective:   Physical Exam HENT:     Right Ear: Tympanic membrane and ear canal normal.     Left Ear: Tympanic membrane and ear canal normal.     Nose: Nose normal.     Right Sinus: No maxillary sinus tenderness or frontal sinus tenderness.     Left Sinus: No maxillary sinus tenderness or frontal sinus tenderness.  Eyes:     Conjunctiva/sclera: Conjunctivae normal.  Neck:     Thyroid: No thyromegaly.     Vascular: No  carotid bruit.  Cardiovascular:     Rate and Rhythm: Normal rate and regular rhythm.     Heart sounds: Normal heart sounds.  Pulmonary:     Effort: Pulmonary effort is normal.     Breath sounds: Normal breath sounds. No wheezing or rales.  Abdominal:     General: Bowel sounds are normal.     Palpations: Abdomen is soft.     Tenderness: There is no abdominal tenderness.  Musculoskeletal:        General: Normal range of motion.     Cervical back: Neck supple.  Skin:    General: Skin is warm and dry.  Neurological:     Mental Status: He is alert and oriented to person, place, and time.     Cranial Nerves: No cranial nerve deficit.     Deep Tendon Reflexes: Reflexes are normal and symmetric.  Psychiatric:        Mood and Affect: Mood normal.           Assessment & Plan:   Problem List Items Addressed This Visit       Cardiovascular and Mediastinum   Essential hypertension   Relevant Orders   Comprehensive metabolic panel     Digestive   Gastroesophageal reflux disease    Controlled.   Continue omeprazole 40 mg daily.         Endocrine   Type 2 diabetes mellitus with hyperglycemia (HCC)    Uncontrolled with A1C today of 8.0.   He admits to some weight gain, also missing several evening doses of metformin.  Continue glipizide XL 10 mg daily. Change metformin to XR 1000 mg once daily due to difficulty with compliance.  Start Ozempic 0.25 mg weekly x 4 weeks, then increase to 0.5 mg weekly thereafter.  Follow up in 3 months.        Relevant Medications   Semaglutide,0.25 or 0.5MG /DOS, (OZEMPIC, 0.25 OR 0.5 MG/DOSE,) 2 MG/3ML SOPN   metFORMIN (GLUCOPHAGE-XR) 500 MG 24 hr tablet   Other Relevant Orders   Microalbumin/Creatinine Ratio, Urine   POCT glycosylated hemoglobin (Hb A1C) (Completed)     Other   Hyperlipidemia    Controlled.  Continue atorvastatin 40 mg daily. Repeat lipid panel pending.       Relevant Orders   Lipid panel   Preventative  health care - Primary    Immunizations UTD. Influenza vaccine provided today. PSA due and pending. Colonoscopy overdue, he agrees to Cologuard. Will send to his home.  Discussed the importance of a healthy diet and regular exercise in order for weight loss, and to reduce the risk of further co-morbidity.  Exam stable. Labs pending.  Follow up in 1  year for repeat physical.       Other Visit Diagnoses     Need for immunization against influenza       Relevant Orders   Flu Vaccine QUAD 56mo+IM (Fluarix, Fluzone & Alfiuria Quad PF) (Completed)   Screening for colon cancer       Relevant Orders   Cologuard   Screening for prostate cancer       Relevant Orders   PSA          Pleas Koch, NP

## 2022-04-08 NOTE — Addendum Note (Signed)
Addended by: Ellamae Sia on: 04/08/2022 03:26 PM   Modules accepted: Orders

## 2022-04-08 NOTE — Assessment & Plan Note (Signed)
Controlled.  Continue atorvastatin 40 mg daily. Repeat lipid panel pending.

## 2022-04-09 LAB — PSA: PSA: 0.66 ng/mL (ref <=4.00)

## 2022-04-09 LAB — COMPREHENSIVE METABOLIC PANEL
AG Ratio: 1.9 (calc) (ref 1.0–2.5)
ALT: 33 U/L (ref 9–46)
AST: 28 U/L (ref 10–35)
Albumin: 4.5 g/dL (ref 3.6–5.1)
Alkaline phosphatase (APISO): 104 U/L (ref 35–144)
BUN: 15 mg/dL (ref 7–25)
CO2: 25 mmol/L (ref 20–32)
Calcium: 9.2 mg/dL (ref 8.6–10.3)
Chloride: 102 mmol/L (ref 98–110)
Creat: 1 mg/dL (ref 0.70–1.30)
Globulin: 2.4 g/dL (calc) (ref 1.9–3.7)
Glucose, Bld: 110 mg/dL — ABNORMAL HIGH (ref 65–99)
Potassium: 3.8 mmol/L (ref 3.5–5.3)
Sodium: 139 mmol/L (ref 135–146)
Total Bilirubin: 1.2 mg/dL (ref 0.2–1.2)
Total Protein: 6.9 g/dL (ref 6.1–8.1)

## 2022-04-09 LAB — MICROALBUMIN / CREATININE URINE RATIO
Creatinine, Urine: 152 mg/dL (ref 20–320)
Microalb Creat Ratio: 7 mcg/mg creat (ref <30)
Microalb, Ur: 1.1 mg/dL

## 2022-04-09 LAB — LIPID PANEL
Cholesterol: 138 mg/dL (ref <200)
HDL: 38 mg/dL — ABNORMAL LOW (ref >=40)
LDL Cholesterol (Calc): 60 mg/dL (calc)
Non-HDL Cholesterol (Calc): 100 mg/dL (calc) (ref <130)
Total CHOL/HDL Ratio: 3.6 (calc) (ref <5.0)
Triglycerides: 332 mg/dL — ABNORMAL HIGH (ref <150)

## 2022-04-13 ENCOUNTER — Other Ambulatory Visit: Payer: Self-pay | Admitting: Primary Care

## 2022-04-13 DIAGNOSIS — E119 Type 2 diabetes mellitus without complications: Secondary | ICD-10-CM

## 2022-04-13 DIAGNOSIS — K219 Gastro-esophageal reflux disease without esophagitis: Secondary | ICD-10-CM

## 2022-04-25 ENCOUNTER — Other Ambulatory Visit: Payer: Self-pay | Admitting: Primary Care

## 2022-04-25 DIAGNOSIS — E782 Mixed hyperlipidemia: Secondary | ICD-10-CM

## 2022-04-26 LAB — COLOGUARD

## 2022-05-01 DIAGNOSIS — Z1211 Encounter for screening for malignant neoplasm of colon: Secondary | ICD-10-CM | POA: Diagnosis not present

## 2022-05-09 LAB — COLOGUARD: COLOGUARD: NEGATIVE

## 2022-05-23 ENCOUNTER — Other Ambulatory Visit: Payer: Self-pay | Admitting: Primary Care

## 2022-05-23 DIAGNOSIS — M79672 Pain in left foot: Secondary | ICD-10-CM

## 2022-05-23 DIAGNOSIS — I10 Essential (primary) hypertension: Secondary | ICD-10-CM

## 2022-06-20 ENCOUNTER — Other Ambulatory Visit: Payer: Self-pay | Admitting: Primary Care

## 2022-06-20 DIAGNOSIS — E1165 Type 2 diabetes mellitus with hyperglycemia: Secondary | ICD-10-CM

## 2022-07-12 ENCOUNTER — Ambulatory Visit (INDEPENDENT_AMBULATORY_CARE_PROVIDER_SITE_OTHER): Payer: BC Managed Care – PPO | Admitting: Primary Care

## 2022-07-12 ENCOUNTER — Ambulatory Visit (INDEPENDENT_AMBULATORY_CARE_PROVIDER_SITE_OTHER)
Admission: RE | Admit: 2022-07-12 | Discharge: 2022-07-12 | Disposition: A | Discharge status: Home / Self Care | Payer: BC Managed Care – PPO | Source: Ambulatory Visit | Attending: Primary Care | Admitting: Primary Care

## 2022-07-12 ENCOUNTER — Encounter: Payer: Self-pay | Admitting: Primary Care

## 2022-07-12 VITALS — BP 138/80 | HR 97 | Temp 97.6°F | Ht 71.0 in | Wt 222.0 lb

## 2022-07-12 DIAGNOSIS — R059 Cough, unspecified: Secondary | ICD-10-CM | POA: Diagnosis not present

## 2022-07-12 DIAGNOSIS — R053 Chronic cough: Secondary | ICD-10-CM

## 2022-07-12 DIAGNOSIS — E1165 Type 2 diabetes mellitus with hyperglycemia: Secondary | ICD-10-CM

## 2022-07-12 LAB — POCT GLYCOSYLATED HEMOGLOBIN (HGB A1C): Hemoglobin A1C: 6.7 % — AB (ref 4.0–5.6)

## 2022-07-12 MED ORDER — AMOXICILLIN-POT CLAVULANATE 875-125 MG PO TABS: 1.0000 | ORAL_TABLET | Freq: Two times a day (BID) | ORAL | 0 refills | Status: DC

## 2022-07-12 NOTE — Patient Instructions (Addendum)
Complete xray(s) prior to leaving today. I will notify you of your results once received.  Start Augmentin antibiotics for the infection Take 1 tablet by mouth twice daily for 7 days.  Stop Sudafed.  Please schedule a follow up visit for 6 months for a diabetes check.  It was a pleasure to see you today!

## 2022-07-12 NOTE — Progress Notes (Signed)
Subjective:    Patient ID: Austin Benson, male    DOB: 1971-01-12, 51 y.o.   MRN: [id number]  Diabetes Associated symptoms include fatigue.    Austin Benson is a very pleasant 51 y.o. male with a history of type 2 diabetes, hypertension, hyperlipidemia who presents today for follow up of diabetes and to discuss chest congestion.   1) Type 2 Diabetes:  Current medications include: Metformin XR 1000 mg daily, Ozempic 0.5 mg weekly, Glipizide XL 10 mg daily.  He/She is checking his/her blood glucose on occasion which is ranging in the mid 100's.   Last A1C: 8.0 in September 2023, 6.7 today Last Eye Exam: Completed in 2023 Last Foot Exam: Due Pneumonia Vaccination: UTD Urine Microalbumin: UTD Statin: atorvastatin   Dietary changes since last visit: He has reduced portion sizes.    Exercise: no exercise   2) Chest Congestion: Symptoms began about 1 month ago nasal congestion, sinus pressure, headaches. He then developed a cough and chest congestion.   Since then his symptoms have waxed and waned. He feels fatigued and not like himself. He's tested negative for Covid-19 infection a few times. He's taken Mucinex, Cold and Flu, Sudafed (every 2-3 days), Afrin without improvement.   He denies fevers, shortness of breath, chest tightness.     Review of Systems  Constitutional:  Positive for fatigue.  HENT:  Positive for congestion. Negative for postnasal drip, sinus pressure and sore throat.   Respiratory:  Positive for cough. Negative for shortness of breath.   Gastrointestinal:  Negative for constipation and nausea.       Mild GERD         Past Medical History:  Diagnosis Date   Chickenpox    Hyperlipidemia     Social History   Socioeconomic History   Marital status: Married    Spouse name: Not on file   Number of children: Not on file   Years of education: Not on file   Highest education level: Not on file  Occupational History   Not on file  Tobacco Use    Smoking status: Former   Smokeless tobacco: Never  Substance and Sexual Activity   Alcohol use: No    Alcohol/week: 0.0 standard drinks of alcohol   Drug use: Not on file   Sexual activity: Not on file  Other Topics Concern   Not on file  Social History Narrative   Married.   2 children, 3 grandchildren.   Work's as a Chartered certified accountant.    Enjoys hunting, fishing.    Social Determinants of Health   Financial Resource Strain: Not on file  Food Insecurity: Not on file  Transportation Needs: Not on file  Physical Activity: Not on file  Stress: Not on file  Social Connections: Not on file  Intimate Partner Violence: Not on file    No past surgical history on file.  Family History  Problem Relation Age of Onset   Arthritis Mother    Diabetes Father    Heart attack Father 21   Diabetes Maternal Grandmother    Arthritis Maternal Grandmother    Diabetes Maternal Grandfather     No Known Allergies  Current Outpatient Medications on File Prior to Visit  Medication Sig Dispense Refill   atorvastatin (LIPITOR) 40 MG tablet TAKE 1 TABLET DAILY FOR CHOLESTEROL 90 tablet 3   glipiZIDE (GLUCOTROL XL) 10 MG 24 hr tablet TAKE 1 TABLET DAILY WITH BREAKFAST FOR DIABETES 90 tablet 1   losartan (COZAAR) 50 MG  tablet Take 1 tablet (50 mg total) by mouth daily. for blood pressure. 90 tablet 2   metFORMIN (GLUCOPHAGE-XR) 500 MG 24 hr tablet Take 2 tablets (1,000 mg total) by mouth daily with breakfast. for diabetes. 180 tablet 1   omeprazole (PRILOSEC) 40 MG capsule TAKE 1 CAPSULE DAILY FOR HEARTBURN 90 capsule 3   Semaglutide,0.25 or 0.5MG /DOS, (OZEMPIC, 0.25 OR 0.5 MG/DOSE,) 2 MG/3ML SOPN Inject 0.5 mg into the skin once a week. for diabetes. 9 mL 0   No current facility-administered medications on file prior to visit.    BP 138/80 (BP Location: Left Arm, Patient Position: Sitting, Cuff Size: Normal)   Pulse 97   Temp 97.6 F (36.4 C) (Temporal)   Ht 5\' 11"  (1.803 m)   Wt 222 lb (100.7 kg)    SpO2 98%   BMI 30.96 kg/m  Objective:   Physical Exam Constitutional:      Appearance: He is not ill-appearing.  HENT:     Right Ear: Tympanic membrane and ear canal normal.     Left Ear: Tympanic membrane and ear canal normal.     Nose: No mucosal edema.     Right Sinus: No maxillary sinus tenderness or frontal sinus tenderness.     Left Sinus: No maxillary sinus tenderness or frontal sinus tenderness.     Mouth/Throat:     Mouth: Mucous membranes are moist.  Eyes:     Conjunctiva/sclera: Conjunctivae normal.  Cardiovascular:     Rate and Rhythm: Normal rate and regular rhythm.  Pulmonary:     Effort: Pulmonary effort is normal.     Breath sounds: Examination of the right-upper field reveals rhonchi. Examination of the left-upper field reveals rhonchi. Examination of the right-lower field reveals wheezing and rhonchi. Examination of the left-lower field reveals rhonchi. Wheezing and rhonchi present. No rales.  Musculoskeletal:     Cervical back: Neck supple.  Skin:    General: Skin is warm and dry.           Assessment & Plan:   Problem List Items Addressed This Visit       Endocrine   Type 2 diabetes mellitus with hyperglycemia (HCC) - Primary    Improved with A1C of 6.7 today!  Continue Ozempic 0.5 mg weekly, Glipizide XL 10 mg daily, Metformin XR 1000 mg in AM.  Follow up in 6 months.      Relevant Orders   POCT glycosylated hemoglobin (Hb A1C) (Completed)     Other   Persistent cough for 3 weeks or longer    Suspicious for bacterial cause, especially given exam today.  Chest xray ordered and pending. Start Augmentin antibiotics.Take 1 tablet by mouth twice daily for 7 days. Consider adding Zpack if pneumonia positive.         Relevant Medications   amoxicillin-clavulanate (AUGMENTIN) 875-125 MG tablet   Other Relevant Orders   DG Chest 2 View       , NP

## 2022-07-12 NOTE — Assessment & Plan Note (Signed)
Suspicious for bacterial cause, especially given exam today.  Chest xray ordered and pending. Start Augmentin antibiotics.Take 1 tablet by mouth twice daily for 7 days. Consider adding Zpack if pneumonia positive.

## 2022-07-12 NOTE — Assessment & Plan Note (Signed)
Improved with A1C of 6.7 today!  Continue Ozempic 0.5 mg weekly, Glipizide XL 10 mg daily, Metformin XR 1000 mg in AM.  Follow up in 6 months.

## 2022-08-31 ENCOUNTER — Other Ambulatory Visit: Payer: Self-pay | Admitting: Primary Care

## 2022-08-31 DIAGNOSIS — E1165 Type 2 diabetes mellitus with hyperglycemia: Secondary | ICD-10-CM

## 2022-09-15 ENCOUNTER — Other Ambulatory Visit: Payer: Self-pay | Admitting: Primary Care

## 2022-09-15 DIAGNOSIS — E1165 Type 2 diabetes mellitus with hyperglycemia: Secondary | ICD-10-CM

## 2022-10-10 ENCOUNTER — Other Ambulatory Visit: Payer: Self-pay | Admitting: Primary Care

## 2022-10-10 DIAGNOSIS — E119 Type 2 diabetes mellitus without complications: Secondary | ICD-10-CM

## 2022-11-23 ENCOUNTER — Other Ambulatory Visit: Payer: Self-pay | Admitting: Primary Care

## 2022-11-23 DIAGNOSIS — E1165 Type 2 diabetes mellitus with hyperglycemia: Secondary | ICD-10-CM

## 2022-12-25 ENCOUNTER — Other Ambulatory Visit: Payer: Self-pay | Admitting: Primary Care

## 2022-12-25 DIAGNOSIS — L551 Sunburn of second degree: Secondary | ICD-10-CM | POA: Diagnosis not present

## 2022-12-25 DIAGNOSIS — E1165 Type 2 diabetes mellitus with hyperglycemia: Secondary | ICD-10-CM

## 2022-12-25 DIAGNOSIS — E119 Type 2 diabetes mellitus without complications: Secondary | ICD-10-CM

## 2022-12-25 DIAGNOSIS — L089 Local infection of the skin and subcutaneous tissue, unspecified: Secondary | ICD-10-CM | POA: Diagnosis not present

## 2023-01-11 ENCOUNTER — Encounter: Payer: Self-pay | Admitting: Primary Care

## 2023-01-11 ENCOUNTER — Ambulatory Visit: Payer: BC Managed Care – PPO | Admitting: Primary Care

## 2023-01-11 VITALS — BP 122/76 | HR 108 | Temp 97.7°F | Ht 71.0 in | Wt 238.0 lb

## 2023-01-11 DIAGNOSIS — E1165 Type 2 diabetes mellitus with hyperglycemia: Secondary | ICD-10-CM

## 2023-01-11 DIAGNOSIS — M79671 Pain in right foot: Secondary | ICD-10-CM | POA: Diagnosis not present

## 2023-01-11 DIAGNOSIS — Z7985 Long-term (current) use of injectable non-insulin antidiabetic drugs: Secondary | ICD-10-CM | POA: Diagnosis not present

## 2023-01-11 DIAGNOSIS — M79672 Pain in left foot: Secondary | ICD-10-CM

## 2023-01-11 DIAGNOSIS — Z7984 Long term (current) use of oral hypoglycemic drugs: Secondary | ICD-10-CM

## 2023-01-11 LAB — POCT GLYCOSYLATED HEMOGLOBIN (HGB A1C): Hemoglobin A1C: 6.9 % — AB (ref 4.0–5.6)

## 2023-01-11 MED ORDER — SEMAGLUTIDE (1 MG/DOSE) 4 MG/3ML ~~LOC~~ SOPN
1.0000 mg | PEN_INJECTOR | SUBCUTANEOUS | 0 refills | Status: DC
Start: 2023-01-11 — End: 2023-03-23

## 2023-01-11 MED ORDER — GABAPENTIN 100 MG PO CAPS: ORAL_CAPSULE | ORAL | 3 refills | Status: DC

## 2023-01-11 NOTE — Progress Notes (Signed)
Subjective:    Patient ID: Austin Benson, male    DOB: Oct 01, 1970, 52 y.o.   MRN: [id number]  HPI  Austin Benson is a very pleasant 52 y.o. male with a history of hypertension, type 2 diabetes, hyperlipidemia who presents today for follow-up of diabetes.   Current medications include: Ozempic 0.5 mg weekly, metformin XR 1000 mg daily, glipizide XL 10 mg daily.   He is checking his blood glucose a few times monthly and is getting readings of:  AM fasting: 150's-160's  Last A1C: 6.7 in December 2023, 6.9 today Last Eye Exam: UTD Last Foot Exam: Due Pneumonia Vaccination: 2020 Urine Microalbumin: Up-to-date Statin: Atorvastatin  Dietary changes since last visit: increased intake of sweets.    Exercise: No regular exercise.   BP Readings from Last 3 Encounters:  01/11/23 122/76  07/12/22 138/80  04/08/22 138/82   Wt Readings from Last 3 Encounters:  01/11/23 238 lb (108 kg)  07/12/22 222 lb (100.7 kg)  04/08/22 232 lb (105.2 kg)    He has noticed a return in his foot pain and tingling to his plantar feet. He resumed his gabapentin and is taking 200 mg in AM and 100 mg in PM. He is needing refills.    Review of Systems  Constitutional:  Positive for fatigue.  Respiratory:  Negative for shortness of breath.   Cardiovascular:  Negative for chest pain.  Neurological:  Positive for numbness.         Past Medical History:  Diagnosis Date   Chickenpox    Hyperlipidemia     Social History   Socioeconomic History   Marital status: Married    Spouse name: Not on file   Number of children: Not on file   Years of education: Not on file   Highest education level: GED or equivalent  Occupational History   Not on file  Tobacco Use   Smoking status: Former   Smokeless tobacco: Never  Substance and Sexual Activity   Alcohol use: No    Alcohol/week: 0.0 standard drinks of alcohol   Drug use: Not on file   Sexual activity: Not on file  Other Topics Concern   Not on  file  Social History Narrative   Married.   2 children, 3 grandchildren.   Work's as a Chartered certified accountant.    Enjoys hunting, fishing.    Social Determinants of Health   Financial Resource Strain: Low Risk  (01/07/2023)   Overall Financial Resource Strain (CARDIA)    Difficulty of Paying Living Expenses: Not hard at all  Food Insecurity: No Food Insecurity (01/07/2023)   Hunger Vital Sign    Worried About Running Out of Food in the Last Year: Never true    Ran Out of Food in the Last Year: Never true  Transportation Needs: No Transportation Needs (01/07/2023)   PRAPARE - Administrator, Civil Service (Medical): No    Lack of Transportation (Non-Medical): No  Physical Activity: Unknown (01/07/2023)   Exercise Vital Sign    Days of Exercise per Week: 0 days    Minutes of Exercise per Session: Not on file  Stress: No Stress Concern Present (01/07/2023)   [employer] of Occupational Health - Occupational Stress Questionnaire    Feeling of Stress : Only a little  Social Connections: Moderately Integrated (01/07/2023)   Social Connection and Isolation Panel [NHANES]    Frequency of Communication with Friends and Family: More than three times a week    Frequency of  Social Gatherings with Friends and Family: Once a week    Attends Religious Services: More than 4 times per year    Active Member of [employer] or Organizations: No    Attends Engineer, structural: Not on file    Marital Status: Married  Catering manager Violence: Not on file    History reviewed. No pertinent surgical history.  Family History  Problem Relation Age of Onset   Arthritis Mother    Diabetes Father    Heart attack Father 55   Diabetes Maternal Grandmother    Arthritis Maternal Grandmother    Diabetes Maternal Grandfather     No Known Allergies  Current Outpatient Medications on File Prior to Visit  Medication Sig Dispense Refill   atorvastatin (LIPITOR) 40 MG tablet TAKE 1 TABLET DAILY FOR  CHOLESTEROL 90 tablet 3   glipiZIDE (GLUCOTROL XL) 10 MG 24 hr tablet TAKE 1 TABLET DAILY WITH BREAKFAST FOR DIABETES 90 tablet 0   losartan (COZAAR) 50 MG tablet Take 1 tablet (50 mg total) by mouth daily. for blood pressure. 90 tablet 2   metFORMIN (GLUCOPHAGE-XR) 500 MG 24 hr tablet TAKE 2 TABLETS DAILY WITH BREAKFAST FOR DIABETES 180 tablet 0   omeprazole (PRILOSEC) 40 MG capsule TAKE 1 CAPSULE DAILY FOR HEARTBURN 90 capsule 3   No current facility-administered medications on file prior to visit.    BP 122/76   Pulse (!) 108   Temp 97.7 F (36.5 C) (Temporal)   Ht 5\' 11"  (1.803 m)   Wt 238 lb (108 kg)   SpO2 94%   BMI 33.19 kg/m  Objective:   Physical Exam Cardiovascular:     Rate and Rhythm: Normal rate and regular rhythm.  Pulmonary:     Effort: Pulmonary effort is normal.     Breath sounds: Normal breath sounds. No wheezing or rales.  Musculoskeletal:     Cervical back: Neck supple.  Skin:    General: Skin is warm and dry.  Neurological:     Mental Status: He is alert and oriented to person, place, and time.           Assessment & Plan:  Type 2 diabetes mellitus with hyperglycemia, without long-term current use of insulin (HCC) Assessment & Plan: Controlled with A1C of 6.9 today.  Increase Ozempic to 1 mg weekly for sweet cravings and weight loss.  Continue metformin XR 1000 mg daily, Glipizide XL 10 mg daily.  Foot exam today. Refilled gabapentin.   Follow up in 3 months for CPE.  Orders: -     POCT glycosylated hemoglobin (Hb A1C) -     Semaglutide (1 MG/DOSE); Inject 1 mg as directed once a week. for diabetes.  Dispense: 9 mL; Refill: 0  Bilateral foot pain Assessment & Plan: Improved with gabapentin.  Resume gabapentin 200 mg in AM and 100 mg in PM. Refills provided.   Orders: -     Gabapentin; Take 2 capsules by mouth in the morning and 1 capsule in the evening for foot pain  Dispense: 270 capsule; Refill: 3        Doreene Nest,  NP

## 2023-01-11 NOTE — Patient Instructions (Addendum)
We increased your dose of Ozempic to 1 mg weekly for diabetes.  Continue gabapentin and metformin as prescribed.  Refills sent to [facility] for gabapentin.  Please schedule a physical to meet with me in 3 months.   It was a pleasure to see you today!

## 2023-01-11 NOTE — Assessment & Plan Note (Addendum)
Controlled with A1C of 6.9 today.  Increase Ozempic to 1 mg weekly for sweet cravings and weight loss.  Continue metformin XR 1000 mg daily, Glipizide XL 10 mg daily.  Foot exam today. Refilled gabapentin.   Follow up in 3 months for CPE.

## 2023-01-11 NOTE — Assessment & Plan Note (Signed)
Improved with gabapentin.  Resume gabapentin 200 mg in AM and 100 mg in PM. Refills provided.

## 2023-02-16 ENCOUNTER — Other Ambulatory Visit: Payer: Self-pay | Admitting: Primary Care

## 2023-02-16 DIAGNOSIS — I10 Essential (primary) hypertension: Secondary | ICD-10-CM

## 2023-02-16 NOTE — Telephone Encounter (Signed)
Patient is due for CPE/follow up in October, this will be required prior to any further refills.  Please schedule, thank you!

## 2023-02-16 NOTE — Telephone Encounter (Signed)
Lvm for patient tcb and schedule 

## 2023-03-14 ENCOUNTER — Other Ambulatory Visit: Payer: Self-pay | Admitting: Primary Care

## 2023-03-14 DIAGNOSIS — E1165 Type 2 diabetes mellitus with hyperglycemia: Secondary | ICD-10-CM

## 2023-03-14 NOTE — Telephone Encounter (Signed)
Patient is due for CPE/follow up in early January 2025, this will be required prior to any further refills.  Please schedule, thank you!

## 2023-03-15 NOTE — Telephone Encounter (Signed)
Patient has been scheduled. Patient wanted to have physical done in October instead of waiting until January.

## 2023-03-23 ENCOUNTER — Other Ambulatory Visit: Payer: Self-pay | Admitting: Primary Care

## 2023-03-23 DIAGNOSIS — E1165 Type 2 diabetes mellitus with hyperglycemia: Secondary | ICD-10-CM

## 2023-03-27 LAB — HM DIABETES EYE EXAM

## 2023-04-10 ENCOUNTER — Other Ambulatory Visit: Payer: Self-pay | Admitting: Primary Care

## 2023-04-10 DIAGNOSIS — K219 Gastro-esophageal reflux disease without esophagitis: Secondary | ICD-10-CM

## 2023-04-19 ENCOUNTER — Other Ambulatory Visit: Payer: Self-pay | Admitting: Primary Care

## 2023-04-19 DIAGNOSIS — E782 Mixed hyperlipidemia: Secondary | ICD-10-CM

## 2023-04-20 ENCOUNTER — Encounter: Payer: Self-pay | Admitting: Primary Care

## 2023-04-20 ENCOUNTER — Ambulatory Visit: Payer: BC Managed Care – PPO | Admitting: Primary Care

## 2023-04-20 VITALS — BP 120/62 | HR 95 | Temp 98.0°F | Ht 70.0 in | Wt 227.0 lb

## 2023-04-20 DIAGNOSIS — Z23 Encounter for immunization: Secondary | ICD-10-CM

## 2023-04-20 DIAGNOSIS — E1165 Type 2 diabetes mellitus with hyperglycemia: Secondary | ICD-10-CM

## 2023-04-20 DIAGNOSIS — Z7984 Long term (current) use of oral hypoglycemic drugs: Secondary | ICD-10-CM

## 2023-04-20 DIAGNOSIS — K219 Gastro-esophageal reflux disease without esophagitis: Secondary | ICD-10-CM | POA: Diagnosis not present

## 2023-04-20 DIAGNOSIS — Z0000 Encounter for general adult medical examination without abnormal findings: Secondary | ICD-10-CM | POA: Diagnosis not present

## 2023-04-20 DIAGNOSIS — I10 Essential (primary) hypertension: Secondary | ICD-10-CM

## 2023-04-20 DIAGNOSIS — Z125 Encounter for screening for malignant neoplasm of prostate: Secondary | ICD-10-CM | POA: Diagnosis not present

## 2023-04-20 DIAGNOSIS — M79672 Pain in left foot: Secondary | ICD-10-CM

## 2023-04-20 DIAGNOSIS — E782 Mixed hyperlipidemia: Secondary | ICD-10-CM

## 2023-04-20 DIAGNOSIS — M79671 Pain in right foot: Secondary | ICD-10-CM

## 2023-04-20 LAB — CBC
HCT: 44.9 % (ref 39.0–52.0)
Hemoglobin: 14.9 g/dL (ref 13.0–17.0)
MCHC: 33.1 g/dL (ref 30.0–36.0)
MCV: 94.2 fL (ref 78.0–100.0)
Platelets: 273 10*3/uL (ref 150.0–400.0)
RBC: 4.77 Mil/uL (ref 4.22–5.81)
RDW: 13 % (ref 11.5–15.5)
WBC: 7.5 10*3/uL (ref 4.0–10.5)

## 2023-04-20 LAB — COMPREHENSIVE METABOLIC PANEL
ALT: 38 U/L (ref 0–53)
AST: 31 U/L (ref 0–37)
Albumin: 4.5 g/dL (ref 3.5–5.2)
Alkaline Phosphatase: 118 U/L — ABNORMAL HIGH (ref 39–117)
BUN: 18 mg/dL (ref 6–23)
CO2: 29 meq/L (ref 19–32)
Calcium: 9.6 mg/dL (ref 8.4–10.5)
Chloride: 102 meq/L (ref 96–112)
Creatinine, Ser: 0.99 mg/dL (ref 0.40–1.50)
GFR: 86.49 mL/min (ref >60.00)
Glucose, Bld: 143 mg/dL — ABNORMAL HIGH (ref 70–99)
Potassium: 4.3 meq/L (ref 3.5–5.1)
Sodium: 138 meq/L (ref 135–145)
Total Bilirubin: 1.1 mg/dL (ref 0.2–1.2)
Total Protein: 6.9 g/dL (ref 6.0–8.3)

## 2023-04-20 LAB — LIPID PANEL
Cholesterol: 103 mg/dL (ref 0–200)
HDL: 31.9 mg/dL — ABNORMAL LOW (ref >39.00)
LDL Cholesterol: 27 mg/dL (ref 0–99)
NonHDL: 70.7
Total CHOL/HDL Ratio: 3
Triglycerides: 218 mg/dL — ABNORMAL HIGH (ref 0.0–149.0)
VLDL: 43.6 mg/dL — ABNORMAL HIGH (ref 0.0–40.0)

## 2023-04-20 LAB — MICROALBUMIN / CREATININE URINE RATIO
Creatinine,U: 115.1 mg/dL
Microalb Creat Ratio: 1.2 mg/g (ref 0.0–30.0)
Microalb, Ur: 1.4 mg/dL (ref 0.0–1.9)

## 2023-04-20 LAB — PSA: PSA: 1.39 ng/mL (ref 0.10–4.00)

## 2023-04-20 LAB — HEMOGLOBIN A1C: Hgb A1c MFr Bld: 7 % — ABNORMAL HIGH (ref 4.6–6.5)

## 2023-04-20 MED ORDER — GABAPENTIN 300 MG PO CAPS
300.0000 mg | ORAL_CAPSULE | Freq: Three times a day (TID) | ORAL | 3 refills | Status: DC
Start: 2023-04-20 — End: 2024-04-19

## 2023-04-20 NOTE — Assessment & Plan Note (Signed)
Immunizations UTD. Influenza vaccine provided today.  Colon cancer screening up-to-date, Cologuard due in 2026 PSA due and pending.  Discussed the importance of a healthy diet and regular exercise in order for weight loss, and to reduce the risk of further co-morbidity.  Exam stable. Labs pending.  Follow up in 1 year for repeat physical.

## 2023-04-20 NOTE — Assessment & Plan Note (Signed)
Repeat lipid panel pending. Continue atorvastatin 40 mg daily.

## 2023-04-20 NOTE — Assessment & Plan Note (Signed)
Overall controlled.  Continue omeprazole 40 mg daily. Continue to watch triggers.

## 2023-04-20 NOTE — Progress Notes (Signed)
Subjective:    Patient ID: Austin Benson, male    DOB: April 13, 1971, 52 y.o.   MRN: [id number]  HPI  Austin Benson is a very pleasant 52 y.o. male who presents today for complete physical and follow up of chronic conditions.   Immunizations: -Tetanus: Completed in 2019 -Influenza: Influenza vaccine provided today.   -Shingles: Completed Shingrix series -Pneumonia: Completed last in 2020  Diet: Fair diet.  Exercise: No regular exercise.  Eye exam: Completes annually  Dental exam: Completes semi-annually    Colonoscopy: Completed Cologuard, due in 2026  PSA: Due   BP Readings from Last 3 Encounters:  04/20/23 120/62  01/11/23 122/76  07/12/22 138/80        Review of Systems  Constitutional:  Negative for unexpected weight change.  HENT:  Negative for rhinorrhea.   Respiratory:  Negative for cough and shortness of breath.   Cardiovascular:  Negative for chest pain.  Gastrointestinal:  Negative for constipation and diarrhea.  Genitourinary:  Negative for difficulty urinating.  Musculoskeletal:  Positive for arthralgias.  Skin:  Negative for rash.  Allergic/Immunologic: Negative for environmental allergies.  Neurological:  Negative for dizziness and headaches.  Psychiatric/Behavioral:  The patient is not nervous/anxious.          Past Medical History:  Diagnosis Date   Chickenpox    Hyperlipidemia    Persistent cough for 3 weeks or longer 09/17/2019    Social History   Socioeconomic History   Marital status: Married    Spouse name: Not on file   Number of children: Not on file   Years of education: Not on file   Highest education level: GED or equivalent  Occupational History   Not on file  Tobacco Use   Smoking status: Former   Smokeless tobacco: Never  Substance and Sexual Activity   Alcohol use: No    Alcohol/week: 0.0 standard drinks of alcohol   Drug use: Not on file   Sexual activity: Not on file  Other Topics Concern   Not on file   Social History Narrative   Married.   2 children, 3 grandchildren.   Work's as a Chartered certified accountant.    Enjoys hunting, fishing.    Social Determinants of Health   Financial Resource Strain: Low Risk  (01/07/2023)   Overall Financial Resource Strain (CARDIA)    Difficulty of Paying Living Expenses: Not hard at all  Food Insecurity: No Food Insecurity (01/07/2023)   Hunger Vital Sign    Worried About Running Out of Food in the Last Year: Never true    Ran Out of Food in the Last Year: Never true  Transportation Needs: No Transportation Needs (01/07/2023)   PRAPARE - Administrator, Civil Service (Medical): No    Lack of Transportation (Non-Medical): No  Physical Activity: Unknown (01/07/2023)   Exercise Vital Sign    Days of Exercise per Week: 0 days    Minutes of Exercise per Session: Not on file  Stress: No Stress Concern Present (01/07/2023)   [employer] of Occupational Health - Occupational Stress Questionnaire    Feeling of Stress : Only a little  Social Connections: Moderately Integrated (01/07/2023)   Social Connection and Isolation Panel [NHANES]    Frequency of Communication with Friends and Family: More than three times a week    Frequency of Social Gatherings with Friends and Family: Once a week    Attends Religious Services: More than 4 times per year    Active Member of  Clubs or Organizations: No    Attends Banker Meetings: Not on file    Marital Status: Married  Intimate Partner Violence: Not on file    History reviewed. No pertinent surgical history.  Family History  Problem Relation Age of Onset   Arthritis Mother    Diabetes Father    Heart attack Father 21   Diabetes Maternal Grandmother    Arthritis Maternal Grandmother    Diabetes Maternal Grandfather     No Known Allergies  Current Outpatient Medications on File Prior to Visit  Medication Sig Dispense Refill   atorvastatin (LIPITOR) 40 MG tablet TAKE 1 TABLET DAILY FOR  CHOLESTEROL 90 tablet 3   glipiZIDE (GLUCOTROL XL) 10 MG 24 hr tablet TAKE 1 TABLET DAILY WITH BREAKFAST FOR DIABETES 90 tablet 0   losartan (COZAAR) 50 MG tablet TAKE 1 TABLET DAILY FOR BLOOD PRESSURE 90 tablet 0   metFORMIN (GLUCOPHAGE-XR) 500 MG 24 hr tablet TAKE 2 TABLETS DAILY WITH BREAKFAST FOR DIABETES 180 tablet 0   omeprazole (PRILOSEC) 40 MG capsule TAKE 1 CAPSULE DAILY FOR HEARTBURN 90 capsule 0   Semaglutide, 1 MG/DOSE, (OZEMPIC, 1 MG/DOSE,) 4 MG/3ML SOPN INJECT 1 MG AS DIRECTED ONCE A WEEK FOR DIABETES 9 mL 0   No current facility-administered medications on file prior to visit.    BP 120/62   Pulse 95   Temp 98 F (36.7 C) (Oral)   Ht 5\' 10"  (1.778 m)   Wt 227 lb (103 kg)   SpO2 95%   BMI 32.57 kg/m  Objective:   Physical Exam HENT:     Right Ear: Tympanic membrane and ear canal normal.     Left Ear: Tympanic membrane and ear canal normal.  Eyes:     Pupils: Pupils are equal, round, and reactive to light.  Cardiovascular:     Rate and Rhythm: Normal rate and regular rhythm.  Pulmonary:     Effort: Pulmonary effort is normal.     Breath sounds: Normal breath sounds.  Abdominal:     General: Bowel sounds are normal.     Palpations: Abdomen is soft.     Tenderness: There is no abdominal tenderness.  Musculoskeletal:        General: Normal range of motion.     Cervical back: Neck supple.  Skin:    General: Skin is warm and dry.  Neurological:     Mental Status: He is alert and oriented to person, place, and time.     Cranial Nerves: No cranial nerve deficit.     Deep Tendon Reflexes:     Reflex Scores:      Patellar reflexes are 2+ on the right side and 2+ on the left side. Psychiatric:        Mood and Affect: Mood normal.           Assessment & Plan:  Preventative health care Assessment & Plan: Immunizations UTD. Influenza vaccine provided today.  Colon cancer screening up-to-date, Cologuard due in 2026 PSA due and pending.  Discussed the  importance of a healthy diet and regular exercise in order for weight loss, and to reduce the risk of further co-morbidity.  Exam stable. Labs pending.  Follow up in 1 year for repeat physical.    Essential hypertension Assessment & Plan: Controlled.  Continue losartan 50 mg daily.  CMP pending.  Orders: -     Comprehensive metabolic panel -     CBC  Gastroesophageal reflux disease, unspecified whether esophagitis  present Assessment & Plan: Overall controlled.  Continue omeprazole 40 mg daily. Continue to watch triggers.    Type 2 diabetes mellitus with hyperglycemia, without long-term current use of insulin (HCC) Assessment & Plan: Repeat A1C pending.  Continue Ozempic 1 mg weekly, glipizide XL 10 mg daily, metformin XR 1000 mg daily.  Increase gabapentin to 300 mg TID due to continued foot pain.   Follow up in 3-6 months.  Orders: -     Microalbumin / creatinine urine ratio -     Hemoglobin A1c  Bilateral foot pain Assessment & Plan: Chronic and continued.  Increase gabapentin to 300 mg TID. He will update.   Orders: -     Gabapentin; Take 1 capsule (300 mg total) by mouth 3 (three) times daily. For foot pain  Dispense: 270 capsule; Refill: 3  Mixed hyperlipidemia Assessment & Plan: Repeat lipid panel pending. Continue atorvastatin 40 mg daily.    Orders: -     Lipid panel  Screening for prostate cancer -     PSA        Doreene Nest, NP

## 2023-04-20 NOTE — Assessment & Plan Note (Signed)
Repeat A1C pending.  Continue Ozempic 1 mg weekly, glipizide XL 10 mg daily, metformin XR 1000 mg daily.  Increase gabapentin to 300 mg TID due to continued foot pain.   Follow up in 3-6 months.

## 2023-04-20 NOTE — Assessment & Plan Note (Signed)
Controlled.   Continue losartan 50 mg daily. CMP pending. 

## 2023-04-20 NOTE — Patient Instructions (Signed)
Stop by the lab prior to leaving today. I will notify you of your results once received.   We increased the dose of your gabapentin to 300 mg capsule.  Take 1 capsule by mouth 3 times daily.  Please schedule a follow up visit for 6 months for a diabetes check.  It was a pleasure to see you today!

## 2023-04-20 NOTE — Assessment & Plan Note (Signed)
Chronic and continued.  Increase gabapentin to 300 mg TID. He will update.

## 2023-04-23 ENCOUNTER — Other Ambulatory Visit: Payer: Self-pay | Admitting: Primary Care

## 2023-04-23 DIAGNOSIS — E119 Type 2 diabetes mellitus without complications: Secondary | ICD-10-CM

## 2023-05-17 ENCOUNTER — Other Ambulatory Visit: Payer: Self-pay | Admitting: Primary Care

## 2023-05-17 DIAGNOSIS — I10 Essential (primary) hypertension: Secondary | ICD-10-CM

## 2023-06-12 ENCOUNTER — Other Ambulatory Visit: Payer: Self-pay | Admitting: Primary Care

## 2023-06-12 DIAGNOSIS — E1165 Type 2 diabetes mellitus with hyperglycemia: Secondary | ICD-10-CM

## 2023-06-15 ENCOUNTER — Other Ambulatory Visit: Payer: Self-pay | Admitting: Primary Care

## 2023-06-15 DIAGNOSIS — E1165 Type 2 diabetes mellitus with hyperglycemia: Secondary | ICD-10-CM

## 2023-07-07 ENCOUNTER — Other Ambulatory Visit: Payer: Self-pay | Admitting: Primary Care

## 2023-07-07 DIAGNOSIS — K219 Gastro-esophageal reflux disease without esophagitis: Secondary | ICD-10-CM

## 2023-09-07 ENCOUNTER — Other Ambulatory Visit: Payer: Self-pay | Admitting: Primary Care

## 2023-09-07 DIAGNOSIS — E1165 Type 2 diabetes mellitus with hyperglycemia: Secondary | ICD-10-CM

## 2023-09-07 NOTE — Telephone Encounter (Signed)
 Lvmtcb. Sent mychart message

## 2023-09-07 NOTE — Telephone Encounter (Signed)
 Patient is due for diabetes follow up in April , this will be required prior to any further refills.   Please schedule, thank you!

## 2023-09-11 ENCOUNTER — Other Ambulatory Visit: Payer: Self-pay | Admitting: Primary Care

## 2023-09-11 DIAGNOSIS — E1165 Type 2 diabetes mellitus with hyperglycemia: Secondary | ICD-10-CM

## 2023-09-20 DIAGNOSIS — J019 Acute sinusitis, unspecified: Secondary | ICD-10-CM | POA: Diagnosis not present

## 2023-09-20 DIAGNOSIS — B9689 Other specified bacterial agents as the cause of diseases classified elsewhere: Secondary | ICD-10-CM | POA: Diagnosis not present

## 2023-10-24 ENCOUNTER — Other Ambulatory Visit: Payer: Self-pay | Admitting: Primary Care

## 2023-10-24 DIAGNOSIS — E119 Type 2 diabetes mellitus without complications: Secondary | ICD-10-CM

## 2023-10-25 ENCOUNTER — Encounter: Payer: Self-pay | Admitting: Primary Care

## 2023-10-25 ENCOUNTER — Ambulatory Visit: Payer: BC Managed Care – PPO | Admitting: Primary Care

## 2023-10-25 VITALS — BP 158/76 | HR 66 | Temp 98.0°F | Ht 70.0 in | Wt 229.0 lb

## 2023-10-25 DIAGNOSIS — Z7984 Long term (current) use of oral hypoglycemic drugs: Secondary | ICD-10-CM

## 2023-10-25 DIAGNOSIS — E1165 Type 2 diabetes mellitus with hyperglycemia: Secondary | ICD-10-CM

## 2023-10-25 DIAGNOSIS — Z7985 Long-term (current) use of injectable non-insulin antidiabetic drugs: Secondary | ICD-10-CM

## 2023-10-25 LAB — POCT GLYCOSYLATED HEMOGLOBIN (HGB A1C): Hemoglobin A1C: 6.7 % — AB (ref 4.0–5.6)

## 2023-10-25 NOTE — Patient Instructions (Signed)
 Please schedule a physical to meet with me in 6 months.   It was a pleasure to see you today!

## 2023-10-25 NOTE — Progress Notes (Signed)
 Subjective:    Patient ID: Austin Benson, male    DOB: 1971/02/04, 53 y.o.   MRN: [id number]  HPI  Austin Benson is a very pleasant 53 y.o. male with a history of hypertension, type 2 diabetes, hyperlipidemia who presents today for follow-up of diabetes.  Current medications include: Glipizide XL 10 mg daily, metformin ER 1000 mg daily, Ozempic 1 mg weekly  He is checking his blood glucose a few times weekly and is getting readings of:  Before dinner: 120-130s  Last A1C: 7.0 in October 2024, 6.7 today Last Eye Exam: Up-to-date Last Foot Exam: Up-to-date Pneumonia Vaccination: Completed in 2020 Urine Microalbumin: Up-to-date Statin: Atorvastatin  Dietary changes since last visit: No, eats out a lot, take out food.    Exercise: None, active at work.  BP Readings from Last 3 Encounters:  10/25/23 (!) 158/76  04/20/23 120/62  01/11/23 122/76    Wt Readings from Last 3 Encounters:  10/25/23 229 lb (103.9 kg)  04/20/23 227 lb (103 kg)  01/11/23 238 lb (108 kg)       Review of Systems  Respiratory:  Negative for shortness of breath.   Cardiovascular:  Negative for chest pain.  Gastrointestinal:  Negative for abdominal pain, diarrhea and nausea.  Neurological:  Positive for numbness.         Past Medical History:  Diagnosis Date   Chickenpox    Hyperlipidemia    Persistent cough for 3 weeks or longer 09/17/2019    Social History   Socioeconomic History   Marital status: Married    Spouse name: Not on file   Number of children: Not on file   Years of education: Not on file   Highest education level: GED or equivalent  Occupational History   Not on file  Tobacco Use   Smoking status: Former   Smokeless tobacco: Never  Substance and Sexual Activity   Alcohol use: No    Alcohol/week: 0.0 standard drinks of alcohol   Drug use: Not on file   Sexual activity: Not on file  Other Topics Concern   Not on file  Social History Narrative   Married.   2  children, 3 grandchildren.   Work's as a Chartered certified accountant.    Enjoys hunting, fishing.    Social Drivers of Corporate investment banker Strain: Low Risk  (10/24/2023)   Overall Financial Resource Strain (CARDIA)    Difficulty of Paying Living Expenses: Not hard at all  Recent Concern: Financial Resource Strain - Medium Risk (09/20/2023)   Received from [facility]   Overall Financial Resource Strain (CARDIA)    Difficulty of Paying Living Expenses: Somewhat hard  Food Insecurity: No Food Insecurity (10/24/2023)   Hunger Vital Sign    Worried About Running Out of Food in the Last Year: Never true    Ran Out of Food in the Last Year: Never true  Transportation Needs: No Transportation Needs (10/24/2023)   PRAPARE - Administrator, Civil Service (Medical): No    Lack of Transportation (Non-Medical): No  Physical Activity: Unknown (10/24/2023)   Exercise Vital Sign    Days of Exercise per Week: 0 days    Minutes of Exercise per Session: Not on file  Stress: No Stress Concern Present (10/24/2023)   [employer] of Occupational Health - Occupational Stress Questionnaire    Feeling of Stress : Not at all  Social Connections: Socially Integrated (10/24/2023)   Social Connection and Isolation Panel [NHANES]  Frequency of Communication with Friends and Family: Three times a week    Frequency of Social Gatherings with Friends and Family: Twice a week    Attends Religious Services: More than 4 times per year    Active Member of [employer] or Organizations: Yes    Attends Engineer, structural: More than 4 times per year    Marital Status: Married  Catering manager Violence: Not on file    History reviewed. No pertinent surgical history.  Family History  Problem Relation Age of Onset   Arthritis Mother    Diabetes Father    Heart attack Father 57   Diabetes Maternal Grandmother    Arthritis Maternal Grandmother    Diabetes Maternal Grandfather     No Known  Allergies  Current Outpatient Medications on File Prior to Visit  Medication Sig Dispense Refill   atorvastatin (LIPITOR) 40 MG tablet TAKE 1 TABLET DAILY FOR CHOLESTEROL 90 tablet 3   gabapentin (NEURONTIN) 300 MG capsule Take 1 capsule (300 mg total) by mouth 3 (three) times daily. For foot pain 270 capsule 3   glipiZIDE (GLUCOTROL XL) 10 MG 24 hr tablet TAKE 1 TABLET DAILY WITH BREAKFAST FOR DIABETES 90 tablet 0   losartan (COZAAR) 50 MG tablet TAKE 1 TABLET DAILY FOR BLOOD PRESSURE 90 tablet 2   metFORMIN (GLUCOPHAGE-XR) 500 MG 24 hr tablet TAKE 2 TABLETS DAILY WITH BREAKFAST FOR DIABETES 180 tablet 0   omeprazole (PRILOSEC) 40 MG capsule TAKE 1 CAPSULE DAILY FOR HEARTBURN 90 capsule 2   Semaglutide, 1 MG/DOSE, (OZEMPIC, 1 MG/DOSE,) 4 MG/3ML SOPN INJECT 1 MG AS DIRECTED ONCE A WEEK FOR DIABETES 9 mL 0   No current facility-administered medications on file prior to visit.    BP (!) 158/76   Pulse 66   Temp 98 F (36.7 C) (Temporal)   Ht 5\' 10"  (1.778 m)   Wt 229 lb (103.9 kg)   SpO2 96%   BMI 32.86 kg/m  Objective:   Physical Exam Cardiovascular:     Rate and Rhythm: Normal rate and regular rhythm.  Pulmonary:     Effort: Pulmonary effort is normal.     Breath sounds: Normal breath sounds.  Musculoskeletal:     Cervical back: Neck supple.  Skin:    General: Skin is warm and dry.  Neurological:     Mental Status: He is alert and oriented to person, place, and time.  Psychiatric:        Mood and Affect: Mood normal.           Assessment & Plan:  Type 2 diabetes mellitus with hyperglycemia, without long-term current use of insulin (HCC) Assessment & Plan: Improved with A1C of 6.7 today.  Continue metformin ER 1000 mg daily, glipizide XL 10 mg daily, Ozempic 1 mg weekly. Work on reducing take [employer]. Increase veggies, lean protein, exercise.  Follow up in 6 months.  Orders: -     POCT glycosylated hemoglobin (Hb A1C)        Doreene Nest,  NP

## 2023-10-25 NOTE — Assessment & Plan Note (Signed)
 Improved with A1C of 6.7 today.  Continue metformin ER 1000 mg daily, glipizide XL 10 mg daily, Ozempic 1 mg weekly. Work on reducing take [employer]. Increase veggies, lean protein, exercise.  Follow up in 6 months.

## 2023-11-21 DIAGNOSIS — J0140 Acute pansinusitis, unspecified: Secondary | ICD-10-CM | POA: Diagnosis not present

## 2023-11-30 ENCOUNTER — Other Ambulatory Visit: Payer: Self-pay | Admitting: Primary Care

## 2023-11-30 DIAGNOSIS — E1165 Type 2 diabetes mellitus with hyperglycemia: Secondary | ICD-10-CM

## 2023-12-11 ENCOUNTER — Other Ambulatory Visit: Payer: Self-pay | Admitting: Primary Care

## 2023-12-11 DIAGNOSIS — E1165 Type 2 diabetes mellitus with hyperglycemia: Secondary | ICD-10-CM

## 2024-01-22 ENCOUNTER — Other Ambulatory Visit: Payer: Self-pay | Admitting: Primary Care

## 2024-01-22 DIAGNOSIS — E119 Type 2 diabetes mellitus without complications: Secondary | ICD-10-CM

## 2024-02-12 ENCOUNTER — Other Ambulatory Visit: Payer: Self-pay | Admitting: Primary Care

## 2024-02-12 DIAGNOSIS — I10 Essential (primary) hypertension: Secondary | ICD-10-CM

## 2024-02-23 ENCOUNTER — Other Ambulatory Visit: Payer: Self-pay | Admitting: Primary Care

## 2024-02-23 DIAGNOSIS — E1165 Type 2 diabetes mellitus with hyperglycemia: Secondary | ICD-10-CM

## 2024-02-23 NOTE — Telephone Encounter (Signed)
Noted, refills sent to pharmacy.

## 2024-02-23 NOTE — Telephone Encounter (Signed)
 Please call patient:  Received refill requset for Ozempic  from a different pharmacy called [facility] pharmacy... is he no longer with [facility]?

## 2024-02-23 NOTE — Telephone Encounter (Signed)
 Called and spoke with patient, [facility] has made a change with Ozempic  and like medications to be dispensed through [facility]. Patient is aware.

## 2024-03-14 LAB — HM DIABETES EYE EXAM

## 2024-03-29 ENCOUNTER — Encounter: Payer: Self-pay | Admitting: Primary Care

## 2024-04-02 ENCOUNTER — Other Ambulatory Visit: Payer: Self-pay | Admitting: Primary Care

## 2024-04-02 DIAGNOSIS — K219 Gastro-esophageal reflux disease without esophagitis: Secondary | ICD-10-CM

## 2024-04-15 ENCOUNTER — Other Ambulatory Visit: Payer: Self-pay | Admitting: Primary Care

## 2024-04-15 DIAGNOSIS — E782 Mixed hyperlipidemia: Secondary | ICD-10-CM

## 2024-04-18 ENCOUNTER — Other Ambulatory Visit: Payer: Self-pay | Admitting: Primary Care

## 2024-04-18 DIAGNOSIS — M79671 Pain in right foot: Secondary | ICD-10-CM

## 2024-05-01 ENCOUNTER — Ambulatory Visit: Admitting: Primary Care

## 2024-05-01 ENCOUNTER — Encounter: Payer: Self-pay | Admitting: Primary Care

## 2024-05-01 VITALS — BP 136/84 | HR 92 | Temp 97.2°F | Ht 70.0 in | Wt 228.0 lb

## 2024-05-01 DIAGNOSIS — E782 Mixed hyperlipidemia: Secondary | ICD-10-CM

## 2024-05-01 DIAGNOSIS — K219 Gastro-esophageal reflux disease without esophagitis: Secondary | ICD-10-CM | POA: Diagnosis not present

## 2024-05-01 DIAGNOSIS — I10 Essential (primary) hypertension: Secondary | ICD-10-CM | POA: Diagnosis not present

## 2024-05-01 DIAGNOSIS — Z125 Encounter for screening for malignant neoplasm of prostate: Secondary | ICD-10-CM

## 2024-05-01 DIAGNOSIS — E1165 Type 2 diabetes mellitus with hyperglycemia: Secondary | ICD-10-CM | POA: Diagnosis not present

## 2024-05-01 DIAGNOSIS — Z0000 Encounter for general adult medical examination without abnormal findings: Secondary | ICD-10-CM

## 2024-05-01 DIAGNOSIS — Z23 Encounter for immunization: Secondary | ICD-10-CM | POA: Diagnosis not present

## 2024-05-01 DIAGNOSIS — M79671 Pain in right foot: Secondary | ICD-10-CM

## 2024-05-01 DIAGNOSIS — M79672 Pain in left foot: Secondary | ICD-10-CM

## 2024-05-01 LAB — COMPREHENSIVE METABOLIC PANEL WITH GFR
ALT: 36 U/L (ref 0–53)
AST: 27 U/L (ref 0–37)
Albumin: 4.5 g/dL (ref 3.5–5.2)
Alkaline Phosphatase: 108 U/L (ref 39–117)
BUN: 14 mg/dL (ref 6–23)
CO2: 30 meq/L (ref 19–32)
Calcium: 9.2 mg/dL (ref 8.4–10.5)
Chloride: 102 meq/L (ref 96–112)
Creatinine, Ser: 1.01 mg/dL (ref 0.40–1.50)
GFR: 83.83 mL/min (ref >60.00)
Glucose, Bld: 123 mg/dL — ABNORMAL HIGH (ref 70–99)
Potassium: 4.5 meq/L (ref 3.5–5.1)
Sodium: 140 meq/L (ref 135–145)
Total Bilirubin: 1.4 mg/dL — ABNORMAL HIGH (ref 0.2–1.2)
Total Protein: 6.9 g/dL (ref 6.0–8.3)

## 2024-05-01 LAB — LIPID PANEL
Cholesterol: 116 mg/dL (ref 0–200)
HDL: 42.6 mg/dL (ref >39.00)
LDL Cholesterol: 38 mg/dL (ref 0–99)
NonHDL: 73.49
Total CHOL/HDL Ratio: 3
Triglycerides: 176 mg/dL — ABNORMAL HIGH (ref 0.0–149.0)
VLDL: 35.2 mg/dL (ref 0.0–40.0)

## 2024-05-01 LAB — PSA: PSA: 0.9 ng/mL (ref 0.10–4.00)

## 2024-05-01 LAB — HEMOGLOBIN A1C: Hgb A1c MFr Bld: 7.3 % — ABNORMAL HIGH (ref 4.6–6.5)

## 2024-05-01 LAB — MICROALBUMIN / CREATININE URINE RATIO
Creatinine,U: 94.7 mg/dL
Microalb Creat Ratio: 10.7 mg/g (ref 0.0–30.0)
Microalb, Ur: 1 mg/dL (ref 0.0–1.9)

## 2024-05-01 NOTE — Assessment & Plan Note (Addendum)
 Repeat A1C pending.  Continue Ozempic  1 mg weekly, metformin  ER 1000 mg daily, glipizide  XL 10 mg daily. Foot exam today.   Urine microalbumin pending.   Follow up in 3-6 months based on A1C result.

## 2024-05-01 NOTE — Assessment & Plan Note (Signed)
 Stable.  Continue  omeprazole 40mg  daily

## 2024-05-01 NOTE — Assessment & Plan Note (Signed)
 Stable.   Continue losartan  50 mg daily. CMP pending.

## 2024-05-01 NOTE — Assessment & Plan Note (Signed)
 Immunizations UTD. Influenza vaccine provided today.  Colon cancer screening due 2026 PSA due and pending.  Discussed the importance of a healthy diet and regular exercise in order for weight loss, and to reduce the risk of further co-morbidity.  Exam stable. Labs pending.  Follow up in 1 year for repeat physical.

## 2024-05-01 NOTE — Assessment & Plan Note (Signed)
Stable.  Continue gabapentin 300 mg TID. 

## 2024-05-01 NOTE — Patient Instructions (Signed)
 Stop by the lab prior to leaving today. I will notify you of your results once received.   Please schedule a follow up visit for 6 months for a diabetes check.  It was a pleasure to see you today!

## 2024-05-01 NOTE — Progress Notes (Signed)
 Subjective:    Patient ID: Austin Benson, male    DOB: 1971/06/09, 53 y.o.   MRN: [id number]  Austin Benson is a very pleasant 53 y.o. male who presents today for complete physical and follow up of chronic conditions.  Immunizations: -Tetanus: Completed in 2019 -Influenza: Influenza vaccine provided today.  -Shingles: Completed Shingrix series -Pneumonia: Completed Pneumovax 23 in 2020  Diet: Fair diet.  Exercise: No regular exercise.  Eye exam: Completes annually  Dental exam: Completes semi-annually    Colonoscopy: Completed Cologuard, due 2026  PSA: Due   BP Readings from Last 3 Encounters:  05/01/24 136/84  10/25/23 (!) 158/76  04/20/23 120/62    Wt Readings from Last 3 Encounters:  05/01/24 228 lb (103.4 kg)  10/25/23 229 lb (103.9 kg)  04/20/23 227 lb (103 kg)        Review of Systems  Constitutional:  Negative for unexpected weight change.  HENT:  Negative for rhinorrhea.   Respiratory:  Negative for cough and shortness of breath.   Cardiovascular:  Negative for chest pain.  Gastrointestinal:  Negative for constipation and diarrhea.  Genitourinary:  Negative for difficulty urinating.  Musculoskeletal:  Negative for arthralgias and myalgias.  Skin:  Negative for rash.  Allergic/Immunologic: Negative for environmental allergies.  Neurological:  Negative for dizziness and headaches.  Psychiatric/Behavioral:  The patient is not nervous/anxious.          Past Medical History:  Diagnosis Date   Chickenpox    Hyperlipidemia    Persistent cough for 3 weeks or longer 09/17/2019    Social History   Socioeconomic History   Marital status: Married    Spouse name: Not on file   Number of children: Not on file   Years of education: Not on file   Highest education level: GED or equivalent  Occupational History   Not on file  Tobacco Use   Smoking status: Former   Smokeless tobacco: Never  Substance and Sexual Activity   Alcohol use: No     Alcohol/week: 0.0 standard drinks of alcohol   Drug use: Not on file   Sexual activity: Not on file  Other Topics Concern   Not on file  Social History Narrative   Married.   2 children, 3 grandchildren.   Work's as a Chartered certified accountant.    Enjoys hunting, fishing.    Social Drivers of Corporate investment banker Strain: Low Risk  (04/27/2024)   Overall Financial Resource Strain (CARDIA)    Difficulty of Paying Living Expenses: Not very hard  Food Insecurity: No Food Insecurity (04/27/2024)   Hunger Vital Sign    Worried About Running Out of Food in the Last Year: Never true    Ran Out of Food in the Last Year: Never true  Transportation Needs: No Transportation Needs (04/27/2024)   PRAPARE - Administrator, Civil Service (Medical): No    Lack of Transportation (Non-Medical): No  Physical Activity: Inactive (04/27/2024)   Exercise Vital Sign    Days of Exercise per Week: 0 days    Minutes of Exercise per Session: Not on file  Stress: Stress Concern Present (04/27/2024)   [employer] of Occupational Health - Occupational Stress Questionnaire    Feeling of Stress: To some extent  Social Connections: Socially Integrated (04/27/2024)   Social Connection and Isolation Panel    Frequency of Communication with Friends and Family: More than three times a week    Frequency of Social Gatherings with Friends and  Family: More than three times a week    Attends Religious Services: More than 4 times per year    Active Member of Clubs or Organizations: Yes    Attends Banker Meetings: More than 4 times per year    Marital Status: Married  Catering manager Violence: Not on file    History reviewed. No pertinent surgical history.  Family History  Problem Relation Age of Onset   Arthritis Mother    Diabetes Father    Heart attack Father 31   Diabetes Maternal Grandmother    Arthritis Maternal Grandmother    Diabetes Maternal Grandfather     No Known  Allergies  Current Outpatient Medications on File Prior to Visit  Medication Sig Dispense Refill   atorvastatin  (LIPITOR) 40 MG tablet TAKE 1 TABLET DAILY FOR CHOLESTEROL 90 tablet 0   gabapentin  (NEURONTIN ) 300 MG capsule TAKE 1 CAPSULE THREE TIMES A DAY FOR FOOT PAIN 270 capsule 0   glipiZIDE  (GLUCOTROL  XL) 10 MG 24 hr tablet TAKE 1 TABLET DAILY WITH BREAKFAST FOR DIABETES 90 tablet 1   losartan  (COZAAR ) 50 MG tablet TAKE 1 TABLET DAILY FOR BLOOD PRESSURE 90 tablet 0   metFORMIN  (GLUCOPHAGE -XR) 500 MG 24 hr tablet TAKE 2 TABLETS DAILY WITH BREAKFAST FOR DIABETES 180 tablet 1   omeprazole  (PRILOSEC) 40 MG capsule TAKE 1 CAPSULE DAILY FOR HEARTBURN 90 capsule 0   OZEMPIC , 1 MG/DOSE, 4 MG/3ML SOPN INJECT 1 MG UNDER THE SKIN ONCE A WEEK FOR DIABETES 9 mL 0   No current facility-administered medications on file prior to visit.    BP 136/84   Pulse 92   Temp (!) 97.2 F (36.2 C) (Temporal)   Ht 5' 10 (1.778 m)   Wt 228 lb (103.4 kg)   SpO2 96%   BMI 32.71 kg/m  Objective:   Physical Exam HENT:     Right Ear: Tympanic membrane and ear canal normal.     Left Ear: Tympanic membrane and ear canal normal.  Eyes:     Pupils: Pupils are equal, round, and reactive to light.  Cardiovascular:     Rate and Rhythm: Normal rate and regular rhythm.  Pulmonary:     Effort: Pulmonary effort is normal.     Breath sounds: Normal breath sounds.  Abdominal:     General: Bowel sounds are normal.     Palpations: Abdomen is soft.     Tenderness: There is no abdominal tenderness.  Musculoskeletal:        General: Normal range of motion.     Cervical back: Neck supple.  Skin:    General: Skin is warm and dry.  Neurological:     Mental Status: He is alert and oriented to person, place, and time.     Cranial Nerves: No cranial nerve deficit.     Deep Tendon Reflexes:     Reflex Scores:      Patellar reflexes are 2+ on the right side and 2+ on the left side. Psychiatric:        Mood and Affect:  Mood normal.     Physical Exam        Assessment & Plan:  Preventative health care Assessment & Plan: Immunizations UTD. Influenza vaccine provided today.  Colon cancer screening due 2026 PSA due and pending.  Discussed the importance of a healthy diet and regular exercise in order for weight loss, and to reduce the risk of further co-morbidity.  Exam stable. Labs pending.  Follow up  in 1 year for repeat physical.    Encounter for immunization -     Flu vaccine trivalent PF, 6mos and older(Flulaval,Afluria,Fluarix,Fluzone)  Essential hypertension Assessment & Plan: Stable.   Continue losartan  50 mg daily. CMP pending.   Gastroesophageal reflux disease, unspecified whether esophagitis present Assessment & Plan: Stable.  Continue omeprazole  40 mg daily.   Type 2 diabetes mellitus with hyperglycemia, without long-term current use of insulin  (HCC) Assessment & Plan: Repeat A1C pending.  Continue Ozempic  1 mg weekly, metformin  ER 1000 mg daily, glipizide  XL 10 mg daily. Foot exam today.   Urine microalbumin pending.   Follow up in 3-6 months based on A1C result.   Orders: -     Hemoglobin A1c -     Microalbumin / creatinine urine ratio -     Comprehensive metabolic panel with GFR  Bilateral foot pain Assessment & Plan: Stable.  Continue gabapentin  300 mg TID.    Mixed hyperlipidemia Assessment & Plan: Repeat lipid panel pending. Continue atorvastatin  40 mg daily  Orders: -     Lipid panel  Screening for prostate cancer -     PSA    Assessment and Plan Assessment & Plan         Comer MARLA Gaskins, NP    History of Present Illness

## 2024-05-01 NOTE — Assessment & Plan Note (Signed)
 Repeat lipid panel pending. Continue atorvastatin 40 mg daily.

## 2024-05-02 ENCOUNTER — Ambulatory Visit: Payer: Self-pay | Admitting: Primary Care

## 2024-05-02 DIAGNOSIS — E1165 Type 2 diabetes mellitus with hyperglycemia: Secondary | ICD-10-CM

## 2024-05-02 MED ORDER — OZEMPIC (2 MG/DOSE) 8 MG/3ML ~~LOC~~ SOPN: 2.0000 mg | PEN_INJECTOR | SUBCUTANEOUS | 1 refills | Status: AC

## 2024-05-13 ENCOUNTER — Other Ambulatory Visit: Payer: Self-pay | Admitting: Primary Care

## 2024-05-13 DIAGNOSIS — I10 Essential (primary) hypertension: Secondary | ICD-10-CM

## 2024-06-07 ENCOUNTER — Other Ambulatory Visit: Payer: Self-pay | Admitting: Primary Care

## 2024-06-07 DIAGNOSIS — E1165 Type 2 diabetes mellitus with hyperglycemia: Secondary | ICD-10-CM

## 2024-07-01 ENCOUNTER — Other Ambulatory Visit: Payer: Self-pay | Admitting: Primary Care

## 2024-07-01 DIAGNOSIS — K219 Gastro-esophageal reflux disease without esophagitis: Secondary | ICD-10-CM

## 2024-07-12 ENCOUNTER — Other Ambulatory Visit: Payer: Self-pay | Admitting: Primary Care

## 2024-07-12 DIAGNOSIS — M79671 Pain in right foot: Secondary | ICD-10-CM

## 2024-07-22 DIAGNOSIS — E119 Type 2 diabetes mellitus without complications: Secondary | ICD-10-CM

## 2024-07-29 ENCOUNTER — Other Ambulatory Visit: Payer: Self-pay

## 2024-07-29 DIAGNOSIS — E782 Mixed hyperlipidemia: Secondary | ICD-10-CM

## 2024-07-29 MED ORDER — ATORVASTATIN CALCIUM 40 MG PO TABS: 40.0000 mg | ORAL_TABLET | Freq: Every day | ORAL | 2 refills | Status: DC

## 2024-08-01 DIAGNOSIS — E782 Mixed hyperlipidemia: Secondary | ICD-10-CM

## 2024-08-01 MED ORDER — ATORVASTATIN CALCIUM 40 MG PO TABS: 40.0000 mg | ORAL_TABLET | Freq: Every day | ORAL | 2 refills | Status: AC

## 2024-08-01 NOTE — Telephone Encounter (Signed)
 Can we call the pharmacy, she just sent some in this month. This was to the [facility]

## 2024-08-02 ENCOUNTER — Other Ambulatory Visit

## 2024-08-02 DIAGNOSIS — E1165 Type 2 diabetes mellitus with hyperglycemia: Secondary | ICD-10-CM

## 2024-08-02 LAB — POCT GLYCOSYLATED HEMOGLOBIN (HGB A1C)
HbA1c POC (<> result, manual entry): 6.7 %
Hemoglobin A1C: 6.7 % — AB (ref 4.0–5.6)

## 2024-08-03 ENCOUNTER — Ambulatory Visit: Payer: Self-pay | Admitting: Primary Care

## 2024-10-30 ENCOUNTER — Ambulatory Visit: Admitting: Primary Care
